# Patient Record
Sex: Female | Born: 1975 | Race: White | Hispanic: No | Marital: Married | Smoking: Never smoker
Health system: Southern US, Community
[De-identification: ages and names within clinical notes are randomized; demographics above are authoritative.]

## PROBLEM LIST (undated history)

## (undated) DIAGNOSIS — R894 Abnormal immunological findings in specimens from other organs, systems and tissues: Secondary | ICD-10-CM

## (undated) DIAGNOSIS — R87619 Unspecified abnormal cytological findings in specimens from cervix uteri: Secondary | ICD-10-CM

## (undated) DIAGNOSIS — D509 Iron deficiency anemia, unspecified: Secondary | ICD-10-CM

## (undated) DIAGNOSIS — K8020 Calculus of gallbladder without cholecystitis without obstruction: Secondary | ICD-10-CM

## (undated) DIAGNOSIS — Z8701 Personal history of pneumonia (recurrent): Secondary | ICD-10-CM

## (undated) DIAGNOSIS — J302 Other seasonal allergic rhinitis: Secondary | ICD-10-CM

## (undated) DIAGNOSIS — E039 Hypothyroidism, unspecified: Secondary | ICD-10-CM

## (undated) DIAGNOSIS — Z8619 Personal history of other infectious and parasitic diseases: Secondary | ICD-10-CM

## (undated) DIAGNOSIS — IMO0002 Reserved for concepts with insufficient information to code with codable children: Secondary | ICD-10-CM

## (undated) HISTORY — DX: Calculus of gallbladder without cholecystitis without obstruction: K80.20

## (undated) HISTORY — DX: Iron deficiency anemia, unspecified: D50.9

## (undated) HISTORY — PX: WISDOM TOOTH EXTRACTION: SHX21

## (undated) HISTORY — DX: Hypothyroidism, unspecified: E03.9

## (undated) HISTORY — DX: Personal history of other infectious and parasitic diseases: Z86.19

## (undated) HISTORY — DX: Other seasonal allergic rhinitis: J30.2

## (undated) HISTORY — DX: Abnormal immunological findings in specimens from other organs, systems and tissues: R89.4

---

## 1998-01-26 ENCOUNTER — Encounter (HOSPITAL_COMMUNITY): Admission: RE | Admit: 1998-01-26 | Discharge: 1998-04-26 | Payer: Self-pay

## 1998-11-29 ENCOUNTER — Other Ambulatory Visit: Admission: RE | Admit: 1998-11-29 | Discharge: 1998-11-29 | Payer: Self-pay | Admitting: Obstetrics and Gynecology

## 2000-03-21 ENCOUNTER — Other Ambulatory Visit: Admission: RE | Admit: 2000-03-21 | Discharge: 2000-03-21 | Payer: Self-pay | Admitting: Obstetrics and Gynecology

## 2009-07-07 ENCOUNTER — Inpatient Hospital Stay (HOSPITAL_COMMUNITY): Admission: AD | Admit: 2009-07-07 | Discharge: 2009-07-07 | Payer: Self-pay | Admitting: Obstetrics and Gynecology

## 2009-12-08 ENCOUNTER — Inpatient Hospital Stay (HOSPITAL_COMMUNITY): Admission: AD | Admit: 2009-12-08 | Discharge: 2009-12-11 | Payer: Self-pay | Admitting: Obstetrics and Gynecology

## 2011-01-10 LAB — CBC
HCT: 25 % — ABNORMAL LOW (ref 36.0–46.0)
HCT: 35.5 % — ABNORMAL LOW (ref 36.0–46.0)
Hemoglobin: 8.3 g/dL — ABNORMAL LOW (ref 12.0–15.0)
MCHC: 33 g/dL (ref 30.0–36.0)
MCV: 87.2 fL (ref 78.0–100.0)
Platelets: 190 10*3/uL (ref 150–400)
RDW: 15.4 % (ref 11.5–15.5)

## 2011-01-10 LAB — RH IMMUNE GLOB WKUP(>/=20WKS)(NOT WOMEN'S HOSP)

## 2011-01-10 LAB — RPR: RPR Ser Ql: NONREACTIVE

## 2011-01-26 LAB — CBC
HCT: 37.8 % (ref 36.0–46.0)
MCV: 91.6 fL (ref 78.0–100.0)
Platelets: 217 10*3/uL (ref 150–400)
RBC: 4.13 MIL/uL (ref 3.87–5.11)
WBC: 12.8 10*3/uL — ABNORMAL HIGH (ref 4.0–10.5)

## 2011-01-26 LAB — RAPID STREP SCREEN (MED CTR MEBANE ONLY): Streptococcus, Group A Screen (Direct): NEGATIVE

## 2011-04-03 LAB — ABO/RH: RH Type: NEGATIVE

## 2011-04-03 LAB — HIV ANTIBODY (ROUTINE TESTING W REFLEX): HIV: NONREACTIVE

## 2011-04-18 LAB — GC/CHLAMYDIA PROBE AMP, GENITAL: Chlamydia: NEGATIVE

## 2011-05-16 ENCOUNTER — Other Ambulatory Visit (HOSPITAL_COMMUNITY): Payer: Self-pay | Admitting: Obstetrics and Gynecology

## 2011-05-16 DIAGNOSIS — R772 Abnormality of alphafetoprotein: Secondary | ICD-10-CM

## 2011-05-16 DIAGNOSIS — Z3689 Encounter for other specified antenatal screening: Secondary | ICD-10-CM

## 2011-06-12 ENCOUNTER — Encounter (HOSPITAL_COMMUNITY): Payer: Self-pay

## 2011-06-12 ENCOUNTER — Ambulatory Visit (HOSPITAL_COMMUNITY): Payer: 59

## 2011-10-18 ENCOUNTER — Encounter (HOSPITAL_COMMUNITY): Payer: Self-pay | Admitting: *Deleted

## 2011-10-18 ENCOUNTER — Inpatient Hospital Stay (HOSPITAL_COMMUNITY)
Admission: AD | Admit: 2011-10-18 | Discharge: 2011-10-18 | Disposition: A | Discharge status: Home / Self Care | Payer: 59 | Source: Ambulatory Visit | Attending: Obstetrics and Gynecology | Admitting: Obstetrics and Gynecology

## 2011-10-18 DIAGNOSIS — O479 False labor, unspecified: Secondary | ICD-10-CM | POA: Insufficient documentation

## 2011-10-18 NOTE — Progress Notes (Signed)
Pt reports contractions since 2230, diarrhea.

## 2011-10-23 NOTE — L&D Delivery Note (Signed)
Delivery Note At 8:55 AM a viable female was delivered via NSVD  (Presentation:OA ;  ).  APGAR:8/9 , ; weight .   Placenta status:INTACT , .3 VESSEL  Cord:  with the following complications: NONE  Anesthesia: Epidural  Episiotomy: NONE Lacerations: NONE Suture Repair:  Est. Blood Loss (mL): 400 CC  Mom to AICU.  Baby to nursery-stable.  Walburga Hudman S 10/25/2011, 9:11 AM

## 2011-10-24 ENCOUNTER — Inpatient Hospital Stay (HOSPITAL_COMMUNITY)
Admission: AD | Admit: 2011-10-24 | Discharge: 2011-10-27 | DRG: 774 | Disposition: A | Discharge status: Home / Self Care | Payer: 59 | Source: Ambulatory Visit | Attending: Obstetrics and Gynecology | Admitting: Obstetrics and Gynecology

## 2011-10-24 ENCOUNTER — Other Ambulatory Visit: Payer: Self-pay | Admitting: Obstetrics and Gynecology

## 2011-10-24 ENCOUNTER — Encounter (HOSPITAL_COMMUNITY): Payer: Self-pay | Admitting: *Deleted

## 2011-10-24 DIAGNOSIS — IMO0002 Reserved for concepts with insufficient information to code with codable children: Principal | ICD-10-CM | POA: Diagnosis present

## 2011-10-24 DIAGNOSIS — O1490 Unspecified pre-eclampsia, unspecified trimester: Secondary | ICD-10-CM

## 2011-10-24 HISTORY — DX: Unspecified abnormal cytological findings in specimens from cervix uteri: R87.619

## 2011-10-24 HISTORY — DX: Reserved for concepts with insufficient information to code with codable children: IMO0002

## 2011-10-24 LAB — COMPREHENSIVE METABOLIC PANEL
AST: 89 U/L — ABNORMAL HIGH (ref 0–37)
Albumin: 2.2 g/dL — ABNORMAL LOW (ref 3.5–5.2)
Calcium: 8.3 mg/dL — ABNORMAL LOW (ref 8.4–10.5)
Creatinine, Ser: 0.65 mg/dL (ref 0.50–1.10)
GFR calc non Af Amer: >90 mL/min (ref >90)

## 2011-10-24 LAB — CBC
MCH: 24.6 pg — ABNORMAL LOW (ref 26.0–34.0)
MCV: 77.8 fL — ABNORMAL LOW (ref 78.0–100.0)
Platelets: 210 10*3/uL (ref 150–400)
RDW: 14.8 % (ref 11.5–15.5)

## 2011-10-24 MED ORDER — MAGNESIUM SULFATE 40 G IN LACTATED RINGERS - SIMPLE
2.0000 g/h | INTRAVENOUS | Status: DC
  Administered 2011-10-24: 2 g/h via INTRAVENOUS
  Filled 2011-10-24: qty 500

## 2011-10-24 MED ORDER — PENICILLIN G POTASSIUM 5000000 UNITS IJ SOLR
5.0000 10*6.[IU] | Freq: Once | INTRAVENOUS | Status: AC
  Administered 2011-10-25: 5 10*6.[IU] via INTRAVENOUS
  Filled 2011-10-24: qty 5

## 2011-10-24 MED ORDER — ZOLPIDEM TARTRATE 10 MG PO TABS: 10.0000 mg | ORAL_TABLET | Freq: Every evening | ORAL | Status: DC | PRN

## 2011-10-24 MED ORDER — CITRIC ACID-SODIUM CITRATE 334-500 MG/5ML PO SOLN: 30.0000 mL | ORAL | Status: DC | PRN

## 2011-10-24 MED ORDER — LIDOCAINE HCL (PF) 1 % IJ SOLN
30.0000 mL | INTRAMUSCULAR | Status: DC | PRN
  Filled 2011-10-24: qty 30

## 2011-10-24 MED ORDER — TERBUTALINE SULFATE 1 MG/ML IJ SOLN: 0.2500 mg | Freq: Once | INTRAMUSCULAR | Status: AC | PRN

## 2011-10-24 MED ORDER — ONDANSETRON HCL 4 MG/2ML IJ SOLN: 4.0000 mg | Freq: Four times a day (QID) | INTRAMUSCULAR | Status: DC | PRN

## 2011-10-24 MED ORDER — ACETAMINOPHEN 325 MG PO TABS: 650.0000 mg | ORAL_TABLET | ORAL | Status: DC | PRN

## 2011-10-24 MED ORDER — OXYTOCIN 20 UNITS IN LACTATED RINGERS INFUSION - SIMPLE
125.0000 mL/h | Freq: Once | INTRAVENOUS | Status: AC
  Administered 2011-10-25: 125 mL/h via INTRAVENOUS
  Filled 2011-10-24: qty 1000

## 2011-10-24 MED ORDER — LACTATED RINGERS IV SOLN: 500.0000 mL | INTRAVENOUS | Status: DC | PRN

## 2011-10-24 MED ORDER — PENICILLIN G POTASSIUM 5000000 UNITS IJ SOLR
2.5000 10*6.[IU] | INTRAVENOUS | Status: DC
  Administered 2011-10-25 (×2): 2.5 10*6.[IU] via INTRAVENOUS
  Filled 2011-10-24 (×4): qty 2.5

## 2011-10-24 MED ORDER — IBUPROFEN 600 MG PO TABS: 600.0000 mg | ORAL_TABLET | Freq: Four times a day (QID) | ORAL | Status: DC | PRN

## 2011-10-24 MED ORDER — OXYTOCIN BOLUS FROM INFUSION
500.0000 mL | Freq: Once | INTRAVENOUS | Status: DC
  Filled 2011-10-24: qty 500

## 2011-10-24 MED ORDER — FLEET ENEMA 7-19 GM/118ML RE ENEM: 1.0000 | ENEMA | RECTAL | Status: DC | PRN

## 2011-10-24 MED ORDER — OXYCODONE-ACETAMINOPHEN 5-325 MG PO TABS: 2.0000 | ORAL_TABLET | ORAL | Status: DC | PRN

## 2011-10-24 MED ORDER — OXYTOCIN 20 UNITS IN LACTATED RINGERS INFUSION - SIMPLE
1.0000 m[IU]/min | INTRAVENOUS | Status: DC
  Administered 2011-10-24: 1 m[IU]/min via INTRAVENOUS
  Filled 2011-10-24: qty 1000

## 2011-10-24 MED ORDER — MAGNESIUM SULFATE BOLUS VIA INFUSION
4.0000 g | Freq: Once | INTRAVENOUS | Status: AC
  Administered 2011-10-24: 4 g via INTRAVENOUS
  Filled 2011-10-24: qty 500

## 2011-10-24 MED ORDER — LACTATED RINGERS IV SOLN
INTRAVENOUS | Status: DC
  Administered 2011-10-24: 23:00:00 via INTRAVENOUS

## 2011-10-24 NOTE — Progress Notes (Signed)
  Subjective:    Patient ID: Katherine Dougherty, female    DOB: 1976-05-11, 36 y.o.   MRN: [id number]  HPI    Review of Systems     Objective:   Physical Exam        Assessment & Plan:

## 2011-10-24 NOTE — H&P (Signed)
  Brett Soza  DICTATION # [id number] CSN# [id number]   Meriel Pica, MD 10/24/2011 10:52 PM

## 2011-10-25 ENCOUNTER — Encounter (HOSPITAL_COMMUNITY): Payer: Self-pay | Admitting: *Deleted

## 2011-10-25 ENCOUNTER — Encounter (HOSPITAL_COMMUNITY): Payer: Self-pay | Admitting: Anesthesiology

## 2011-10-25 ENCOUNTER — Inpatient Hospital Stay (HOSPITAL_COMMUNITY): Payer: 59 | Admitting: Anesthesiology

## 2011-10-25 LAB — CBC
Hemoglobin: 10.4 g/dL — ABNORMAL LOW (ref 12.0–15.0)
MCHC: 31.1 g/dL (ref 30.0–36.0)
RDW: 15 % (ref 11.5–15.5)
WBC: 15.9 10*3/uL — ABNORMAL HIGH (ref 4.0–10.5)

## 2011-10-25 LAB — MRSA PCR SCREENING: MRSA by PCR: NEGATIVE

## 2011-10-25 MED ORDER — ONDANSETRON HCL 4 MG/2ML IJ SOLN: 4.0000 mg | INTRAMUSCULAR | Status: DC | PRN

## 2011-10-25 MED ORDER — LACTATED RINGERS IV SOLN
INTRAVENOUS | Status: DC
  Administered 2011-10-25: 22:00:00 via INTRAVENOUS

## 2011-10-25 MED ORDER — ZOLPIDEM TARTRATE 5 MG PO TABS: 5.0000 mg | ORAL_TABLET | Freq: Every evening | ORAL | Status: DC | PRN

## 2011-10-25 MED ORDER — FLEET ENEMA 7-19 GM/118ML RE ENEM: 1.0000 | ENEMA | Freq: Every day | RECTAL | Status: DC | PRN

## 2011-10-25 MED ORDER — DIPHENHYDRAMINE HCL 25 MG PO CAPS: 25.0000 mg | ORAL_CAPSULE | Freq: Four times a day (QID) | ORAL | Status: DC | PRN

## 2011-10-25 MED ORDER — MAGNESIUM SULFATE 40 G IN LACTATED RINGERS - SIMPLE
2.0000 g/h | INTRAVENOUS | Status: DC
  Administered 2011-10-25: 2 g/h via INTRAVENOUS
  Filled 2011-10-25: qty 500

## 2011-10-25 MED ORDER — WITCH HAZEL-GLYCERIN EX PADS: 1.0000 "application " | MEDICATED_PAD | CUTANEOUS | Status: DC | PRN

## 2011-10-25 MED ORDER — BISACODYL 10 MG RE SUPP: 10.0000 mg | Freq: Every day | RECTAL | Status: DC | PRN

## 2011-10-25 MED ORDER — EPHEDRINE 5 MG/ML INJ
10.0000 mg | INTRAVENOUS | Status: DC | PRN
  Filled 2011-10-25: qty 4

## 2011-10-25 MED ORDER — SODIUM BICARBONATE 8.4 % IV SOLN
INTRAVENOUS | Status: DC | PRN
  Administered 2011-10-25: 4 mL via EPIDURAL

## 2011-10-25 MED ORDER — SIMETHICONE 80 MG PO CHEW: 80.0000 mg | CHEWABLE_TABLET | ORAL | Status: DC | PRN

## 2011-10-25 MED ORDER — PHENYLEPHRINE 40 MCG/ML (10ML) SYRINGE FOR IV PUSH (FOR BLOOD PRESSURE SUPPORT): 80.0000 ug | PREFILLED_SYRINGE | INTRAVENOUS | Status: DC | PRN

## 2011-10-25 MED ORDER — FENTANYL 2.5 MCG/ML BUPIVACAINE 1/10 % EPIDURAL INFUSION (WH - ANES)
INTRAMUSCULAR | Status: DC | PRN
  Administered 2011-10-25 (×2): 14 mL/h via EPIDURAL

## 2011-10-25 MED ORDER — LANOLIN HYDROUS EX OINT: TOPICAL_OINTMENT | CUTANEOUS | Status: DC | PRN

## 2011-10-25 MED ORDER — BENZOCAINE-MENTHOL 20-0.5 % EX AERO: 1.0000 "application " | INHALATION_SPRAY | CUTANEOUS | Status: DC | PRN

## 2011-10-25 MED ORDER — ONDANSETRON HCL 4 MG PO TABS: 4.0000 mg | ORAL_TABLET | ORAL | Status: DC | PRN

## 2011-10-25 MED ORDER — DIPHENHYDRAMINE HCL 50 MG/ML IJ SOLN: 12.5000 mg | INTRAMUSCULAR | Status: DC | PRN

## 2011-10-25 MED ORDER — OXYCODONE-ACETAMINOPHEN 5-325 MG PO TABS
1.0000 | ORAL_TABLET | ORAL | Status: DC | PRN
  Administered 2011-10-25 (×2): 1 via ORAL
  Administered 2011-10-25: 2 via ORAL
  Filled 2011-10-25 (×2): qty 1
  Filled 2011-10-25: qty 2

## 2011-10-25 MED ORDER — IBUPROFEN 600 MG PO TABS
600.0000 mg | ORAL_TABLET | Freq: Four times a day (QID) | ORAL | Status: DC
  Administered 2011-10-25 – 2011-10-27 (×9): 600 mg via ORAL
  Filled 2011-10-25 (×10): qty 1

## 2011-10-25 MED ORDER — PRENATAL MULTIVITAMIN CH
1.0000 | ORAL_TABLET | Freq: Every day | ORAL | Status: DC
  Administered 2011-10-25 – 2011-10-27 (×3): 1 via ORAL
  Filled 2011-10-25 (×3): qty 1

## 2011-10-25 MED ORDER — PHENYLEPHRINE 40 MCG/ML (10ML) SYRINGE FOR IV PUSH (FOR BLOOD PRESSURE SUPPORT)
80.0000 ug | PREFILLED_SYRINGE | INTRAVENOUS | Status: DC | PRN
  Administered 2011-10-25: 80 ug via INTRAVENOUS
  Filled 2011-10-25: qty 5

## 2011-10-25 MED ORDER — EPHEDRINE 5 MG/ML INJ: 10.0000 mg | INTRAVENOUS | Status: DC | PRN

## 2011-10-25 MED ORDER — DIBUCAINE 1 % RE OINT: 1.0000 "application " | TOPICAL_OINTMENT | RECTAL | Status: DC | PRN

## 2011-10-25 MED ORDER — FENTANYL 2.5 MCG/ML BUPIVACAINE 1/10 % EPIDURAL INFUSION (WH - ANES)
14.0000 mL/h | INTRAMUSCULAR | Status: DC
  Filled 2011-10-25 (×2): qty 60

## 2011-10-25 MED ORDER — SENNOSIDES-DOCUSATE SODIUM 8.6-50 MG PO TABS
2.0000 | ORAL_TABLET | Freq: Every day | ORAL | Status: DC
  Administered 2011-10-25 – 2011-10-26 (×2): 2 via ORAL

## 2011-10-25 MED ORDER — TETANUS-DIPHTH-ACELL PERTUSSIS 5-2.5-18.5 LF-MCG/0.5 IM SUSP
0.5000 mL | Freq: Once | INTRAMUSCULAR | Status: DC
  Filled 2011-10-25: qty 0.5

## 2011-10-25 MED ORDER — LACTATED RINGERS IV SOLN: 500.0000 mL | Freq: Once | INTRAVENOUS | Status: DC

## 2011-10-25 NOTE — Anesthesia Postprocedure Evaluation (Signed)
  Anesthesia Post-op Note  Patient: Katherine Dougherty  Procedure(s) Performed: * No procedures listed *  Patient Location: 372  Anesthesia Type: Epidural  Level of Consciousness: awake, alert  and oriented  Airway and Oxygen Therapy: Patient Spontanous Breathing  Post-op Pain: mild  Post-op Assessment: Post-op Vital signs reviewed, Patient's Cardiovascular Status Stable, No headache, No backache, No residual numbness and No residual motor weakness  Post-op Vital Signs: Reviewed and stable  Complications: No apparent anesthesia complications

## 2011-10-25 NOTE — Progress Notes (Signed)
Patient ID: Katherine Dougherty, female   DOB: Aug 12, 1976, 36 y.o.   MRN: [id number] IUP AT 37.0 WITH PIH AND ELEVATED LFT'S BP OK VSS CERVIX RIM WITH VTX AT -1 FHR WITH ACCELS GOOD VARIBILITY SOME MILD VARIABLES ON PITOCIN AND MAGNESIUM CONTINUE SHOULD BE PUSHING SOON

## 2011-10-25 NOTE — Progress Notes (Deleted)
Patient ID: Katherine Dougherty, female   DOB: 09/22/1976, 37 y.o.   MRN: [id number] Up to 16 mu of pitocin bp's doing well Cervix 80 % ft dilation vtx at -1 some effacemeent fhr flat ritght now some accel no decel bu did receive stadol and phenergan Still om magnesium disscussed options either primary c/s versus repeat cytotec  Desires cytotec and try third day of induction

## 2011-10-25 NOTE — Anesthesia Procedure Notes (Signed)

## 2011-10-25 NOTE — H&P (Signed)
NAMEPARNEET, Katherine Dougherty NO.:  [id number]  MEDICAL RECORD NO.:  [id number]  LOCATION:                                 FACILITY:  PHYSICIAN:  Duke Salvia. Marcelle Overlie, M.D.DATE OF BIRTH:  1976-01-28  DATE OF ADMISSION: DATE OF DISCHARGE:                             HISTORY & PHYSICAL   CHIEF COMPLAINT:  Thirty 35 weeks, preeclampsia, elevated liver function test.  HISTORY OF PRESENT ILLNESS:  This is a 36 year old, G2, P4, EDD November 12, 2011.  She is a 35+ weeks, was seen in the office late last week with proteinuria and elevated blood pressure, NST was reactive.  She was brought back in today for followup.  Today, BP was 150/82.  Urine was 0- 1+ protein with reactive NST.  Cervix was favorable for induction.  PIH labs were drawn, came back late this evening showing elevations of her liver function tests.  Platelets were normal.  She presents now for labor induction near term with preeclampsia.  She is Rh negative.  PAST MEDICAL HISTORY:  Please see the [facility] form for details.  PHYSICAL EXAMINATION:  VITAL SIGNS:  Temp 98.2, blood pressure 150/82. HEENT:  Unremarkable. NECK:  Supple without masses. LUNGS:  Clear. CARDIOVASCULAR:  Regular rate and rhythm without murmurs, rubs, or gallops. BREASTS:  Not examined. ABDOMEN:  35-cm fundal height.  Fetal heart rate was 142.  Cervix was 3, 75%, vertex, -2. EXTREMITIES:  Reflexes 1 to 2+, no clonus, 1 to 2+ edema noted also.  IMPRESSION: 1. A 35 plus-week intrauterine pregnancy. 2. Favorable cervix for induction. 3. Preeclampsia with elevated liver function tests.  PLAN:  Admit for magnesium sulfate, AROM, labor induction.     Roberto Romanoski M. Marcelle Overlie, M.D.     [facility]/MEDQ  D:  10/24/2011  T:  10/24/2011  Job:  [id number]

## 2011-10-25 NOTE — Plan of Care (Signed)
Problem: Phase I Progression Outcomes Goal: Other Phase I Outcomes/Goals Outcome: Progressing Pt presents c elevated Liver enzymes post Vaginal delivery, BP's slightly elevated. 140's/80's. Placed on Magnesium Sulfate.

## 2011-10-25 NOTE — Anesthesia Preprocedure Evaluation (Signed)
Anesthesia Evaluation  Patient identified by MRN, date of birth, ID band Patient awake    Reviewed: Allergy & Precautions, H&P , Patient's Chart, lab work & pertinent test results  Airway Mallampati: III TM Distance: >3 FB Neck ROM: full    Dental  (+) Teeth Intact   Pulmonary  clear to auscultation        Cardiovascular hypertension (Preecclampsia-on Mg+2), regular Normal    Neuro/Psych    GI/Hepatic   Endo/Other  Morbid obesity  Renal/GU      Musculoskeletal   Abdominal   Peds  Hematology   Anesthesia Other Findings       Reproductive/Obstetrics (+) Pregnancy                           Anesthesia Physical Anesthesia Plan  ASA: III  Anesthesia Plan: Epidural   Post-op Pain Management:    Induction:   Airway Management Planned:   Additional Equipment:   Intra-op Plan:   Post-operative Plan:   Informed Consent: I have reviewed the patients History and Physical, chart, labs and discussed the procedure including the risks, benefits and alternatives for the proposed anesthesia with the patient or authorized representative who has indicated his/her understanding and acceptance.   Dental Advisory Given  Plan Discussed with:   Anesthesia Plan Comments: (Labs checked- platelets confirmed with RN in room. Fetal heart tracing, per RN, reported to be stable enough for sitting procedure. Discussed epidural, and patient consents to the procedure:  included risk of possible headache,backache, failed block, allergic reaction, and nerve injury. This patient was asked if she had any questions or concerns before the procedure started. )        Anesthesia Quick Evaluation

## 2011-10-26 LAB — COMPREHENSIVE METABOLIC PANEL
ALT: 42 U/L — ABNORMAL HIGH (ref 0–35)
AST: 60 U/L — ABNORMAL HIGH (ref 0–37)
Albumin: 1.7 g/dL — ABNORMAL LOW (ref 3.5–5.2)
Alkaline Phosphatase: 122 U/L — ABNORMAL HIGH (ref 39–117)
CO2: 24 mEq/L (ref 19–32)
Chloride: 106 mEq/L (ref 96–112)
Potassium: 3.9 mEq/L (ref 3.5–5.1)
Total Bilirubin: 0.1 mg/dL — ABNORMAL LOW (ref 0.3–1.2)

## 2011-10-26 LAB — CBC
Platelets: 222 10*3/uL (ref 150–400)
RBC: 3.71 MIL/uL — ABNORMAL LOW (ref 3.87–5.11)
RDW: 15 % (ref 11.5–15.5)
WBC: 9.6 10*3/uL (ref 4.0–10.5)

## 2011-10-26 NOTE — Progress Notes (Signed)
UR chart review completed.  

## 2011-10-26 NOTE — Plan of Care (Signed)
Problem: Discharge Progression Outcomes Goal: Complications resolved/controlled Outcome: Progressing Still on post mag VS and I+O for fluid monitoring until am.

## 2011-10-26 NOTE — Progress Notes (Signed)
Post Partum Day 1 Subjective: no complaints and tolerating PO  Objective: Blood pressure 126/61, pulse 88, temperature 98 F (36.7 C), temperature source Oral, resp. rate 20, height 5\' 6"  (1.676 m), weight 110.859 kg (244 lb 6.4 oz), SpO2 97.00%, unknown if currently breastfeeding.  Physical Exam:  General: alert, cooperative and no distress Lochia: appropriate Uterine Fundus: firm Incision:  DVT Evaluation: No evidence of DVT seen on physical exam.   Basename 10/26/11 0530 10/25/11 1007  HGB 9.0* 10.4*  HCT 29.0* 33.4*    Assessment/Plan: PP preeclampsia UOP good Labs improving DC MG and transfer to floor   LOS: 2 days   Katherine Dougherty C 10/26/2011, 9:54 AM

## 2011-10-27 LAB — COMPREHENSIVE METABOLIC PANEL
AST: 39 U/L — ABNORMAL HIGH (ref 0–37)
CO2: 24 mEq/L (ref 19–32)
Calcium: 7.7 mg/dL — ABNORMAL LOW (ref 8.4–10.5)
Creatinine, Ser: 0.75 mg/dL (ref 0.50–1.10)
GFR calc non Af Amer: >90 mL/min (ref >90)
Total Protein: 4.9 g/dL — ABNORMAL LOW (ref 6.0–8.3)

## 2011-10-27 LAB — CBC
MCH: 24.7 pg — ABNORMAL LOW (ref 26.0–34.0)
MCHC: 31.4 g/dL (ref 30.0–36.0)
MCV: 78.8 fL (ref 78.0–100.0)
Platelets: 213 10*3/uL (ref 150–400)
RDW: 15.2 % (ref 11.5–15.5)

## 2011-10-27 LAB — URIC ACID: Uric Acid, Serum: 6 mg/dL (ref 2.4–7.0)

## 2011-10-27 NOTE — Progress Notes (Signed)
Post Partum Day 2 Subjective: no complaints, up ad lib and tolerating PO  Objective: Blood pressure 93/54, pulse 87, temperature 97.9 F (36.6 C), temperature source Oral, resp. rate 18, height 5\' 6"  (1.676 m), weight 110.859 kg (244 lb 6.4 oz), SpO2 98.00%, unknown if currently breastfeeding.  Physical Exam:  General: alert, cooperative and no distress Lochia: appropriate Uterine Fundus: firm Incision:  DVT Evaluation: No evidence of DVT seen on physical exam.   Basename 10/27/11 0752 10/26/11 0530  HGB 9.0* 9.0*  HCT 28.7* 29.0*    Assessment/Plan: Plan for discharge tomorrow Stable post Mag for Preeclampsia, BPS stable, Labs Still with elevated SGOT/SGPT Recheck in am   LOS: 3 days   Timica Marcom C 10/27/2011, 8:37 AM

## 2011-10-27 NOTE — Progress Notes (Signed)
Pt without complaints Desires dc home VSSAF  SGOT and SGPT only mildly elevated  DC home with fu 1 week for BP check and repeat labs DL

## 2011-10-29 NOTE — Discharge Summary (Signed)
Obstetric Discharge Summary Reason for Admission: induction of labor Prenatal Procedures: ultrasound Intrapartum Procedures: spontaneous vaginal delivery Postpartum Procedures: none Complications-Operative and Postpartum: none Hemoglobin  Date Value Range Status  10/27/2011 9.0* 12.0-15.0 (g/dL) Final     HCT  Date Value Range Status  10/27/2011 28.7* 36.0-46.0 (%) Final    Discharge Diagnoses: s/p spont vag delivery at 36 weeks, PIH  Discharge Information: Date: 10/29/2011 Activity: pelvic rest Diet: routine Medications: PNV and Ibuprofen Condition: stable Instructions: refer to practice specific booklet Discharge to: home   Newborn Data: Live born female  Birth Weight: 7 lb 15.9 oz (3625 g) APGAR: 9, 9  Home with mother.  Shayaan Parke G 10/29/2011, 8:30 AM

## 2011-12-10 ENCOUNTER — Ambulatory Visit (HOSPITAL_COMMUNITY)
Admission: RE | Admit: 2011-12-10 | Discharge: 2011-12-10 | Disposition: A | Discharge status: Home / Self Care | Payer: 59 | Source: Ambulatory Visit | Attending: Obstetrics and Gynecology | Admitting: Obstetrics and Gynecology

## 2011-12-10 NOTE — Progress Notes (Signed)
Adult Lactation Consultation Outpatient Visit Note  Patient Name: Katherine Dougherty     BABY:  Katherine Dougherty Date of Birth: 16-Jan-1976          DOB:  [birth year]   BIRTH WEIGHT:  7-15.9 Gestational Age at Delivery: 37.4        WEIGHT TODAY:  11-14.4 Type of Delivery: NSVD  Breastfeeding History: Frequency of Breastfeeding: 4-6 TIMES/24 HOURS Length of Feeding: 15-30 MINUTES Voids: QS Stools: QS  Supplementing / Method:  BOTTLE PC OR IN PLACE OF FEEDINGS 4-6 TIMES/DAY Pumping:  Type of Pump:DEBP   Frequency:6-8 TIMES/24 HOURS  Volume:  4-5 OZ PER BREAST  Comments:    Consultation Evaluation:  Mom is here today to build her confidence that she can breastfeed baby without pumping pc and bottlefeeding.  Mom states she is a " control freak" and got into the habit of pumping and bottlefeeding for the first 2 weeks when baby was receiving phototherapy.  Discussed what her breastfeeding goals were and she states it would be easier if baby could breastfeed more and she could do less pumping.  Mom states she worries baby doesn't get enough at the breast so she  pumps after and offers pc bottle most of the time.  A lot of teaching done on how to assess a good feeding with good milk transfer.  She has a very good milk supply and baby is gaining great weight.  Observed a feeding and only minor adjustments needed with positioning.  Baby took several minutes of popping on and off before getting into a rhythmic suck/swallow pattern x 15 min.  Breast was very full prior to feeding and softened well after feeding.  Softening of breast, audible gulping and very relaxed baby after feeding pointed out to mom.  Baby transferred 118 mls at feeding.  Mom states she feels more confident and plans to begin weaning slowly from extra pumping.  Initial Feeding Assessment:Right breast for 15 min. Pre-feed [id number] Post-feed [id number] Amount Transferred:118 mls Comments:  Additional Feeding Assessment: Pre-feed  Weight: Post-feed Weight: Amount Transferred: Comments:  Additional Feeding Assessment: Pre-feed Weight: Post-feed Weight: Amount Transferred: Comments:  Total Breast milk Transferred this Visit: 118 mls Total Supplement Given: none  Additional Interventions:   Follow-Up  Call prn      Hansel Feinstein 12/10/2011, 5:42 PM

## 2014-03-05 ENCOUNTER — Other Ambulatory Visit: Payer: Self-pay | Admitting: Internal Medicine

## 2014-03-05 ENCOUNTER — Ambulatory Visit
Admission: RE | Admit: 2014-03-05 | Discharge: 2014-03-05 | Disposition: A | Discharge status: Home / Self Care | Payer: BC Managed Care – PPO | Source: Ambulatory Visit | Attending: Internal Medicine | Admitting: Internal Medicine

## 2014-03-05 DIAGNOSIS — R221 Localized swelling, mass and lump, neck: Principal | ICD-10-CM

## 2014-03-05 DIAGNOSIS — R220 Localized swelling, mass and lump, head: Secondary | ICD-10-CM

## 2014-08-23 ENCOUNTER — Encounter (HOSPITAL_COMMUNITY): Payer: Self-pay | Admitting: *Deleted

## 2015-06-15 ENCOUNTER — Ambulatory Visit (INDEPENDENT_AMBULATORY_CARE_PROVIDER_SITE_OTHER): Payer: BLUE CROSS/BLUE SHIELD | Admitting: Internal Medicine

## 2015-06-15 ENCOUNTER — Encounter: Payer: Self-pay | Admitting: Internal Medicine

## 2015-06-15 VITALS — BP 150/100 | Temp 98.7°F | Ht 65.75 in | Wt 241.8 lb

## 2015-06-15 DIAGNOSIS — M2550 Pain in unspecified joint: Secondary | ICD-10-CM

## 2015-06-15 DIAGNOSIS — Z8379 Family history of other diseases of the digestive system: Secondary | ICD-10-CM

## 2015-06-15 DIAGNOSIS — IMO0001 Reserved for inherently not codable concepts without codable children: Secondary | ICD-10-CM

## 2015-06-15 DIAGNOSIS — Z2821 Immunization not carried out because of patient refusal: Secondary | ICD-10-CM | POA: Diagnosis not present

## 2015-06-15 DIAGNOSIS — R030 Elevated blood-pressure reading, without diagnosis of hypertension: Secondary | ICD-10-CM

## 2015-06-15 DIAGNOSIS — L509 Urticaria, unspecified: Secondary | ICD-10-CM

## 2015-06-15 DIAGNOSIS — E039 Hypothyroidism, unspecified: Secondary | ICD-10-CM

## 2015-06-15 DIAGNOSIS — E079 Disorder of thyroid, unspecified: Secondary | ICD-10-CM

## 2015-06-15 LAB — FERRITIN: Ferritin: 45.4 ng/mL (ref 10.0–291.0)

## 2015-06-15 LAB — IBC PANEL
IRON: 66 ug/dL (ref 42–145)
Saturation Ratios: 16 % — ABNORMAL LOW (ref 20.0–50.0)
TRANSFERRIN: 295 mg/dL (ref 212.0–360.0)

## 2015-06-15 NOTE — Patient Instructions (Addendum)
Get [place] a copy of  Your labs   And ov .   From [facility].  For now.  Genetic test for celiac, celiac 10 panel, ferritin , ibc level,  Consideration of seeing Gi for a firm diagnosis as possible .  Your blood pressure is up today but could be from stress of ov. Take blood pressure readings twice a day for - 10 - 14 days and then periodically .To ensure below 140/90   .Send in readings      Gluten-Free Diet for Celiac Disease Gluten is a protein found in wheat, rye, barley, and triticale (a cross between wheat and rye) grains. People with celiac disease need to have a gluten-free diet. With celiac disease, gluten interferes with the absorption of food and may also cause intestinal injury.  Strict compliance is important even during symptom-free periods. This means eliminating all foods with gluten from your diet permanently. This requires some significant changes but is very manageable. WHAT DO I NEED TO KNOW ABOUT A GLUTEN-FREE DIET?  Look for items labeled with "GF." Looking for GF will make it easier to identify products that are safe to eat.  Read all labels. Gluten may have been added as a minor ingredient where least expected, such as in shredded cheeses or ice creams. Always check food labels and investigate questionable ingredients. Talk to your dietitian or health care provider if you have questions about certain foods or need help finding GF foods.  Check when in doubt. If you are not sure whether an ingredient contains gluten, check with the manufacturer. Note that some manufacturers may change ingredients without notice. Always read labels.   Know how food is prepared. Since flour and cereal products are often used in the preparation of foods, it is important to be aware of the methods of preparation used, as well as the ingredients in the foods themselves. This is especially true when you are dining out. Ask restaurants if they have a gluten-free menu.  Watch for  cross-contamination. Cross-contamination occurs when gluten-free foods come into contact with foods that contain gluten. It often happens during the manufacturing process. Always check the ingredient list and for warnings on packages, such as "may contain gluten."  Eat a balanced diet. It is important to still get enough fiber, iron, and B vitamins in your diet. Look for enriched whole grain gluten-free products and continue to eat a well-balanced diet of the important non-grain items, such as vegetables, fruit, lean proteins, legumes, and dairy.  Consider taking a gluten-free multivitamin and mineral supplement. Discuss this with your health care provider. WHAT KEY WORDS HELP IDENTIFY GLUTEN? Know key words to help identify gluten. A dietitian can help you identify possible harmful ingredients in the foods you normally eat. Words to check for on food labels include:   Flour, enriched flour, bromated flour, white flour, durum flour, graham flour, phosphated flour, self-rising flour, semolina, or farina.  Starch, dextrin, modified food starch, or cereal.  Thickening, fillers, or emulsifiers.  Any kind of malt flavoring, extract, or syrup (malt is made from barley and includes malt vinegar, malted milk, and malted beverages).  Hydrolyzed vegetable protein. WHAT FOODS CAN I EAT? Below is a list of common foods that are allowed with a gluten-free diet.  Grains Products made from the following flours or grains:amaranth,bean flours, 100% buckwheat flour, corn, millet, nut flours or meals, GF oats, quinoa, rice, sorghum, teff, any all-purpose 100% GF flour mix, rice wafers, pure cornmeal tortillas, popcorn, some  crackers, some chips, and hot cereals made from cornmeal. Ask your dietitian which specific hot and cold cereals are allowed. Hominy, rice or wild rice, and special GF pasta. Some Asian rice noodles or bean noodles. Arrowroot starch, corn bran, corn flour, corn germ, cornmeal, corn starch,  potato flour, potato starch flour, and rice bran. Rice flours: plain, brown, and sweet. Rice polish, soy flour, tapioca starch. Vegetables All plain, fresh, frozen, or canned vegetables.  Fruits All fresh, frozen, canned, dried fruits, and fruit juices.  Meats and Other Protein Foods Meat, fish, poultry, or eggs prepared without added wheat, rye, barley, or triticale. Some luncheon meat and some frankfurters. Pure meat. All aged cheese, most processed cheese products, some cottage cheese, and some cream cheese. Dried beans, dried peas, and lentils.  Dairy Milk and yogurt made with allowed ingredients.  Beverages Coffee (regular or decaffeinated), tea, herbal tea (read label to be sure that no wheat flour has been added). Carbonated beverages and some root beers. Wine, sake, and distilled spirits, such as gin, vodka, and whiskey. GF beers and GF ciders.  Sweetsand Desserts Sugar, honey, some syrups, molasses, jelly, jam, plain hard candy, marshmallows, gumdrops, homemade candies free of wheat, rye, barley, or triticale. Coconut. Custard, some pudding mixes, and homemade puddings from cornstarch, rice, and tapioca. Gelatin desserts, sorbets, frozen ice pops, and sherbet. Cake, cookies, and other desserts prepared with allowed flours. Some commercial ice creams. Ask your dietitian about specific brands of dessert that are allowed.  Fats and Oils Butter, margarine, vegetable oil, sour cream not containing modified food starch, whipping cream, shortening, lard, cream, and some mayonnaise. Some commercial salad dressings. Peanut butter.  Other Homemade broth and soups made with allowed ingredients; some canned or frozen soups. Any other combination or prepared foods that do not contain gluten. Monosodium glutamate (MSG). Cider, rice, and wine vinegar. Baking soda and baking powder. Certain soy sauces (Tamari). Ask your dietitian about specific brands that are allowed. Nuts, coconut, chocolate, and  pure cocoa powder. Salt, pepper, herbs, spices, extracts, and food colorings. The items listed above may not be a complete list of allowed foods or beverages. Contact your dietitian for more options.  WHAT FOODS CAN I NOT EAT? Below is a list of common foods that are not allowed with a gluten-free diet.  Grains Barley, bran, bulgur, cracked wheat, graham, malt, matzo, wheat germ, and all wheat and rye cereals including spelt and kamut. Avoid cereals containing malt as a flavoring, such as rice cereal. Also avoid regular noodles, spaghetti, macaroni, and most packaged rice mixes, and all others containing wheat, rye, barley, or triticale.  Vegetables Most creamed vegetables, most vegetables canned in sauces, and any vegetables prepared with wheat, rye, barley, or triticale.  Fruits Thickened or prepared fruits and some pie fillings.  Meats and Other Protein Sources Any meat or meat alternative containing wheat, rye, barley, or gluten stabilizers (such as some hot dogs, salami, cold cuts, or sausage). Bread-containing products, such as Swiss steak, croquettes, and meatloaf. Most tuna canned in vegetable broth, [place] with hydrolyzed vegetable protein (HVP) injected as part of the basting, and any cheese product containing oat gum as an ingredient. [place]. Imitation fish. Dairy Commercial chocolate milk, which may have cereal added, and malted milk. Beverages Certain cereal beverages. Beer and ciders (unless GF), ale, malted milk, and some root beers. Sweetsand Desserts Commercial candies containing wheat, rye, barley, or triticale. Certain toffees are dusted with wheat flour. Chocolate-coated nuts, which are often rolled in flour. Cakes,  cookies, doughnuts, and pastries that are prepared with wheat, barley, rye, or triticale flour. Some commercial ice creams, ice cream flavors which contain cookies, crumbs, or cheesecake. Ice cream cones. Commercially prepared mixes for cakes, cookies, and other  desserts unless marked GF. Bread pudding and other puddings thickened with flour. Fats and Oils Some commercial salad dressings and sour cream containing modified food starch.  Condiments Some curry powder, some dry seasoning mixes, some gravy extracts, some meat sauces, some ketchup, some prepared mustard, horseradish. Other All soups containing wheat, rye, barley, or triticale flour. Bouillon and bouillon cubes that contain HVP. Combination or prepared foods that contain gluten. Some soy sauce, some chip dips, and some chewing gum. Yeast extract (contains barley). Caramel color (may contain malt). The items listed above may not be a complete list of foods and beverages to avoid. Contact your dietitian for more information. Document Released: 10/08/2005 Document Revised: 02/22/2014 Document Reviewed: 08/12/2013 [facility] Patient Information 2015 [place], [place]. This information is not intended to replace advice given to you by your health care provider. Make sure you discuss any questions you have with your health care provider. Celiac Disease Celiac disease is a digestive disease that causes your body's natural defense system (immune system) to react against its own cells. It interferes with taking in (absorbing) nutrients from food. Celiac disease is also known as celiac sprue, nontropical sprue, and gluten-sensitive enteropathy. People who have celiac disease cannot tolerate gluten. Gluten is a protein found in wheat, rye, and barley. With time, celiac disease will damage the cells lining the small intestine. This leads to being unable to absorb nutrients from food (malabsorption), diarrhea, and nutritional problems. CAUSES  Celiac disease is genetic. This means you have a higher likelihood of getting the disease if someone in your family has or has had it. Up to 10% of your close relatives (parent, sibling, child) may also have the disease.  People with celiac disease tend to have other  autoimmune diseases. The link may be genetic. These diseases include:  Dermatitis herpetiformis.  Thyroid disease.  Systemic lupus erythematosus.  Type 1 diabetes.  Liver disease.  Collagen vascular disease.  Rheumatoid arthritis.  Sjogren syndrome. SYMPTOMS  The symptoms of celiac disease vary from person to person. The symptoms are generally digestive or nutritional. Digestive symptoms include:  Recurring belly (abdominal) bloating and pain.  Gas.  Long-term (chronic) diarrhea.  Pale, bad-smelling, greasy, or oily stool. Nutritional symptoms include:  Failure to thrive in infants.  Delayed growth in children.  Weight loss in children and adults.  Missed menstrual periods (often due to extreme weight loss).  Anemia.  Weakening bones (osteoporosis).  Fatigue and weakness.  Tingling or other signs of nerve damage (peripheral neuropathy).  Depression. DIAGNOSIS  If your symptoms and physical exam suggest that a digestive disorder or malnutrition is present, your caregiver may suspect celiac disease. You may have already begun a gluten-free diet. If symptoms persist, testing may be needed to confirm the diagnosis. Some tests are best done while you are on a normal, unrestricted diet. Tests may include:  Blood tests to check for nutritional deficiencies.  Blood tests to look for evidence that the body is producing antibodies against its own small intestine cells.  Taking a tissue sample (biopsy) from the small bowel for evaluation.  X-rays of the small bowel.  Evaluating the stool for fat.  Tests to check for nutrient absorption from the intestine. TREATMENT  It is important to seek treatment. Untreated celiac disease can  cause growth problems (in children), anemia, osteoporosis, and possible nerve problems. A pregnant patient with untreated celiac disease has a higher risk of miscarriage, and the fetus has an increased risk of low birth weight and other  growth problems. If celiac disease is diagnosed in the early stages, treatment can allow you to live a long, nearly symptom-free life. Treatment includes following a gluten-free diet. This means avoiding all foods that contain gluten. Eating even a small amount of gluten can damage your intestine. For most people, following this diet will stop symptoms. It will heal existing intestinal damage and prevent further damage. Improvements begin within days of starting the diet. The small intestine is usually completely healed within 3 to 6 months, or it may take up to 2 years for older adults. A small percentage of people do not improve on the gluten-free diet. Depending on your age and the stage at which you were diagnosed, some problems such as delayed growth and discolored teeth may not improve. Sometimes, damaged intestines cannot heal. If your intestines are not absorbing enough nutrients, you may need to receive nutrition supplements through an intravenous (IV) tube. Drug treatments are being tested for unresponsive celiac disease. In this case, you may need to be evaluated for complications of the disease. Your caregiver may also recommend:  A pneumonia vaccination.  Nutrients and other treatments for any nutritional deficiencies. Your caregiver can provide you with more information on a gluten-free diet. Discussion with a dietitian skilled in this illness will be valuable. Support groups may also be helpful. HOME CARE INSTRUCTIONS   Focus on a gluten-free diet. This diet must become a way of life.  Monitor your response to the gluten-free diet and treat any nutritional deficiencies.  Prepare ahead of time if you decide to eat outside the home.  Make and keep your regular follow-up visits with your caregiver.  Suggest to family members that they get screened for early signs of the disease. SEEK MEDICAL CARE IF:   You continue to have digestive symptoms (gas, cramping, diarrhea) despite a  proper diet.  You have trouble sticking to the gluten-free diet.  You develop an itchy rash with groups of tiny blisters.  You develop severe weakness, balance problems, menstrual problems, or depression. Document Released: 10/08/2005 Document Revised: 02/22/2014 Document Reviewed: 01/25/2010 [facility] Patient Information 2015 [place], [place]. This information is not intended to replace advice given to you by your health care provider. Make sure you discuss any questions you have with your health care provider.

## 2015-06-15 NOTE — Progress Notes (Signed)
Pre visit review using our clinic review tool, if applicable. No additional management support is needed unless otherwise documented below in the visit note.  Chief Complaint  Patient presents with  . Establish Care    HPI: Patient  Katherine Dougherty  39 y.o. comes in today for New patient  Health Care visit  Previous PCP  At [facility] .  Sees dr Juliene Pina.    Thyroid :    Initially had  Evaluation  From  Allergist  Hives    Dx hypothyroid .   No specific allergen dx  nnot allergic to meat .... Saw endocrinologist put on synthroid      And then had hand   Joint swelling  Had and  stiffness and feeling bad  and   Other  Odd issues .    Has mirena  And finally in desperation went to  [place] and had diet  Changes  food issue . changed synthroid 125 ot current prep   Diary  free and gluten free   Hands felt better in 2 weeks     Still ache at times but not swollen    Blood tests done at pcp include rf anticcp ana   Not aware of other tests  Offered rheum consult  Also exercise s  In the interim .       Sis had celiac positive  Bowel biopsy.  Just recently    Father  Thyroid.   On armour thyroid.   Last labs thyroid was ok? To repeat in  Another month   GI  Constipation .  As a kid  No ibs dx    Taking Zyrtec and pepcid for hive    ADHD  On adderall  :Since   Age 43- 72 . Except for preg    Medication  School when younger was ok ( mom Brewing technologist)   Problem in college .    Ion and out twice until finished in  Music and sociology  Seen on campus.   ? Some LD.  Never did accomodation.  .     Med helps   Socially  Mom and  Husband .   Lisa  Pulhlous.    Family can tell when off med  And notes improvement infocus Health Maintenance  Topic Date Due  . HIV Screening  08/24/1992  . INFLUENZA VACCINE  06/13/2016 (Originally 05/23/2015)  . TETANUS/TDAP  10/22/2020   Health Maintenance Review LIFESTYLE:  Exercise:  Swim other Tobacco/ETS:no Alcohol: no Sugar  beverages: Sleep: 8 hours Drug use: no ast pap feb 2016  ROS: see hpi  GEN/ HEENT: No fever, significant weight changes sweats headaches vision problems hearing changes, CV/ PULM; No chest pain shortness of breath cough, syncope,edema  change in exercise tolerance. GI /GU: No adominal pain, vomiting, change in bowel habits. No blood in the stool. No significant GU symptoms. SKIN/HEME: ,no acute skin rashes suspicious lesions or bleeding. No lymphadenopathy, nodules, masses.  NEURO/ PSYCH:  No neurologic signs such as weakness numbness. No depression anxiety. IMM/ Allergy: No unusual infections.  Allergy  Hives .   REST of 12 system review negative except as per HPI   Past Medical History  Diagnosis Date  . Abnormal Pap smear   . Hx of varicella   . Seasonal allergies   . Hypothyroid     Past Surgical History  Procedure Laterality Date  . Wisdom tooth extraction      Family  History  Problem Relation Age of Onset  . Arthritis Maternal Grandmother   . Hyperlipidemia Maternal Grandfather   . Heart disease Maternal Grandfather   . Hypertension Maternal Grandfather   . Thyroid disease Father   . Cancer - Other Mother     neck ln and salivary?   . Celiac disease Sister     pos biopsy.   . Thyroid disease Sister     Social History   Social History  . Marital Status: Married    Spouse Name: N/A  . Number of Children: N/A  . Years of Education: N/A   Social History Main Topics  . Smoking status: Never Smoker   . Smokeless tobacco: Never Used  . Alcohol Use: No  . Drug Use: No  . Sexual Activity: Yes   Other Topics Concern  . None   Social History Narrative   8 HOURS OF SLEEP PER NIGHT   LIVES AT HOME WITH HER HUSBAND AND 2 CHILDREN (5 & 3 YRS)   WORKS PART-TIME DOING HAIR   No pets    swim   G2 P2    [employer] music [employer] sociology  [employer]   works as Theme park manager     No outpatient prescriptions prior to visit.   No facility-administered medications prior to visit.   neg fam hx fo ibs uc crohns    EXAM:  BP 150/100 mmHg  Temp(Src) 98.7 F (37.1 C) (Oral)  Ht 5' 5.75" (1.67 m)  Wt 241 lb 12.8 oz (109.68 kg)  BMI 39.33 kg/m2  Body mass index is 39.33 kg/(m^2).  Physical Exam: Vital signs reviewed JJH:ERDE is a well-developed well-nourished alert cooperative    who appearsr stated age in no acute distress.  HEENT: normocephalic atraumatic , Eyes: PERRL EOM's full, conjunctiva clear, Nares: paten,t no deformity discharge or tenderness., Ears: no deformity EAC's clear TMs with normal landmarks. Mouth: clear OP, no lesions, edema.  Moist mucous membranes. Dentition in adequate repair.? Slight stare  NECK: supple without masses, thyroid mass or bruits. CHEST/PULM:  Clear to auscultation and percussion breath sounds equal no wheeze , rales or rhonchi. No chest wall deformities or tenderness. CV: PMI is nondisplaced, S1 S2 no gallops, murmurs, rubs. Peripheral pulses are full without delay.No JVD .  ABDOMEN: Bowel sounds normal nontender  No guard or rebound, no hepato splenomegal no CVA tenderness.Extremtities:  No clubbing cyanosis or edema, no acute joint swelling or redness no focal atrophy NEURO:  Oriented x3, cranial nerves 3-12 appear to be intact, no obvious focal weakness,gait within normal limits SKIN: No acute rashes normal turgor, color, no bruising or petechiae. PSYCH: Oriented, good eye contact, no obvious depression anxiety, cognition and judgment appear normal. Some exess motor acitivy but no tremor  LN: no cervical  adenopathy  Lab Results  Component Value Date   WBC 10.4 10/27/2011   HGB 9.0* 10/27/2011   HCT 28.7* 10/27/2011   PLT 213 10/27/2011   GLUCOSE 74 10/27/2011   ALT 37* 10/27/2011   AST 39* 10/27/2011   NA 136 10/27/2011   K 4.4 10/27/2011   CL 108 10/27/2011   CREATININE 0.75 10/27/2011   BUN 8 10/27/2011   CO2 24 10/27/2011    ASSESSMENT AND PLAN:  Discussed the following assessment and plan:  Joint pain -  Better on gluten-free dairy free diet. - Plan: Celiac panel 10, Ferritin, IBC panel, HLA typing for celiac disease, Hepatitis C antibody  Family history of celiac disease - Plan: Celiac panel 10, Ferritin,  IBC panel, HLA typing for celiac disease  Hives - allergy eval poss from thyroid disease - Plan: Celiac panel 10, Ferritin, IBC panel, HLA typing for celiac disease  Thyroid disease - Plan: Celiac panel 10, Ferritin, IBC panel, HLA typing for celiac disease  Elevated BP - says usu nl and goes up in office will check out of office  to make sure in range  Hypothyroidism, unspecified hypothyroidism type - Suspect autoimmune don't have the lab results for antibodies  Influenza vaccination declined Interesting history with family history strong of thyroid disease and now her sister with documented celiac disease. She has a picture of her hands when they were swollen almost like sausage digits but she doesn't have psoriasis and seems to have responded to dietary changes. She works with her hands on a regular basis and may have a little arthritis but sounds like more was going on then overuse. Patient Care Team: Burnis Medin, MD as PCP - General (Internal Medicine) Dian Queen, MD as Consulting Physician (Obstetrics and Gynecology) Noemi Chapel, NP as Nurse Practitioner Patient Instructions  Get [place] a copy of  Your labs   And ov .   From [facility].  For now.  Genetic test for celiac, celiac 10 panel, ferritin , ibc level,  Consideration of seeing Gi for a firm diagnosis as possible .  Your blood pressure is up today but could be from stress of ov. Take blood pressure readings twice a day for - 10 - 14 days and then periodically .To ensure below 140/90   .Send in readings      Gluten-Free Diet for Celiac Disease Gluten is a protein found in wheat, rye, barley, and triticale (a cross between wheat and rye) grains. People with celiac disease need to have a gluten-free diet. With  celiac disease, gluten interferes with the absorption of food and may also cause intestinal injury.  Strict compliance is important even during symptom-free periods. This means eliminating all foods with gluten from your diet permanently. This requires some significant changes but is very manageable. WHAT DO I NEED TO KNOW ABOUT A GLUTEN-FREE DIET?  Look for items labeled with "GF." Looking for GF will make it easier to identify products that are safe to eat.  Read all labels. Gluten may have been added as a minor ingredient where least expected, such as in shredded cheeses or ice creams. Always check food labels and investigate questionable ingredients. Talk to your dietitian or health care provider if you have questions about certain foods or need help finding GF foods.  Check when in doubt. If you are not sure whether an ingredient contains gluten, check with the manufacturer. Note that some manufacturers may change ingredients without notice. Always read labels.   Know how food is prepared. Since flour and cereal products are often used in the preparation of foods, it is important to be aware of the methods of preparation used, as well as the ingredients in the foods themselves. This is especially true when you are dining out. Ask restaurants if they have a gluten-free menu.  Watch for cross-contamination. Cross-contamination occurs when gluten-free foods come into contact with foods that contain gluten. It often happens during the manufacturing process. Always check the ingredient list and for warnings on packages, such as "may contain gluten."  Eat a balanced diet. It is important to still get enough fiber, iron, and B vitamins in your diet. Look for enriched whole grain gluten-free products and continue to eat  a well-balanced diet of the important non-grain items, such as vegetables, fruit, lean proteins, legumes, and dairy.  Consider taking a gluten-free multivitamin and mineral supplement.  Discuss this with your health care provider. WHAT KEY WORDS HELP IDENTIFY GLUTEN? Know key words to help identify gluten. A dietitian can help you identify possible harmful ingredients in the foods you normally eat. Words to check for on food labels include:   Flour, enriched flour, bromated flour, white flour, durum flour, graham flour, phosphated flour, self-rising flour, semolina, or farina.  Starch, dextrin, modified food starch, or cereal.  Thickening, fillers, or emulsifiers.  Any kind of malt flavoring, extract, or syrup (malt is made from barley and includes malt vinegar, malted milk, and malted beverages).  Hydrolyzed vegetable protein. WHAT FOODS CAN I EAT? Below is a list of common foods that are allowed with a gluten-free diet.  Grains Products made from the following flours or grains:amaranth,bean flours, 100% buckwheat flour, corn, millet, nut flours or meals, GF oats, quinoa, rice, sorghum, teff, any all-purpose 100% GF flour mix, rice wafers, pure cornmeal tortillas, popcorn, some crackers, some chips, and hot cereals made from cornmeal. Ask your dietitian which specific hot and cold cereals are allowed. Hominy, rice or wild rice, and special GF pasta. Some Asian rice noodles or bean noodles. Arrowroot starch, corn bran, corn flour, corn germ, cornmeal, corn starch, potato flour, potato starch flour, and rice bran. Rice flours: plain, brown, and sweet. Rice polish, soy flour, tapioca starch. Vegetables All plain, fresh, frozen, or canned vegetables.  Fruits All fresh, frozen, canned, dried fruits, and fruit juices.  Meats and Other Protein Foods Meat, fish, poultry, or eggs prepared without added wheat, rye, barley, or triticale. Some luncheon meat and some frankfurters. Pure meat. All aged cheese, most processed cheese products, some cottage cheese, and some cream cheese. Dried beans, dried peas, and lentils.  Dairy Milk and yogurt made with allowed ingredients.   Beverages Coffee (regular or decaffeinated), tea, herbal tea (read label to be sure that no wheat flour has been added). Carbonated beverages and some root beers. Wine, sake, and distilled spirits, such as gin, vodka, and whiskey. GF beers and GF ciders.  Sweetsand Desserts Sugar, honey, some syrups, molasses, jelly, jam, plain hard candy, marshmallows, gumdrops, homemade candies free of wheat, rye, barley, or triticale. Coconut. Custard, some pudding mixes, and homemade puddings from cornstarch, rice, and tapioca. Gelatin desserts, sorbets, frozen ice pops, and sherbet. Cake, cookies, and other desserts prepared with allowed flours. Some commercial ice creams. Ask your dietitian about specific brands of dessert that are allowed.  Fats and Oils Butter, margarine, vegetable oil, sour cream not containing modified food starch, whipping cream, shortening, lard, cream, and some mayonnaise. Some commercial salad dressings. Peanut butter.  Other Homemade broth and soups made with allowed ingredients; some canned or frozen soups. Any other combination or prepared foods that do not contain gluten. Monosodium glutamate (MSG). Cider, rice, and wine vinegar. Baking soda and baking powder. Certain soy sauces (Tamari). Ask your dietitian about specific brands that are allowed. Nuts, coconut, chocolate, and pure cocoa powder. Salt, pepper, herbs, spices, extracts, and food colorings. The items listed above may not be a complete list of allowed foods or beverages. Contact your dietitian for more options.  WHAT FOODS CAN I NOT EAT? Below is a list of common foods that are not allowed with a gluten-free diet.  Grains Barley, bran, bulgur, cracked wheat, graham, malt, matzo, wheat germ, and all wheat and rye cereals  including spelt and kamut. Avoid cereals containing malt as a flavoring, such as rice cereal. Also avoid regular noodles, spaghetti, macaroni, and most packaged rice mixes, and all others containing  wheat, rye, barley, or triticale.  Vegetables Most creamed vegetables, most vegetables canned in sauces, and any vegetables prepared with wheat, rye, barley, or triticale.  Fruits Thickened or prepared fruits and some pie fillings.  Meats and Other Protein Sources Any meat or meat alternative containing wheat, rye, barley, or gluten stabilizers (such as some hot dogs, salami, cold cuts, or sausage). Bread-containing products, such as Swiss steak, croquettes, and meatloaf. Most tuna canned in vegetable broth, [place] with hydrolyzed vegetable protein (HVP) injected as part of the basting, and any cheese product containing oat gum as an ingredient. Seitan. Imitation fish. Dairy Commercial chocolate milk, which may have cereal added, and malted milk. Beverages Certain cereal beverages. Beer and ciders (unless GF), ale, malted milk, and some root beers. Sweetsand Desserts Commercial candies containing wheat, rye, barley, or triticale. Certain toffees are dusted with wheat flour. Chocolate-coated nuts, which are often rolled in flour. Cakes, cookies, doughnuts, and pastries that are prepared with wheat, barley, rye, or triticale flour. Some commercial ice creams, ice cream flavors which contain cookies, crumbs, or cheesecake. Ice cream cones. Commercially prepared mixes for cakes, cookies, and other desserts unless marked GF. Bread pudding and other puddings thickened with flour. Fats and Oils Some commercial salad dressings and sour cream containing modified food starch.  Condiments Some curry powder, some dry seasoning mixes, some gravy extracts, some meat sauces, some ketchup, some prepared mustard, horseradish. Other All soups containing wheat, rye, barley, or triticale flour. Bouillon and bouillon cubes that contain HVP. Combination or prepared foods that contain gluten. Some soy sauce, some chip dips, and some chewing gum. Yeast extract (contains barley). Caramel color (may contain  malt). The items listed above may not be a complete list of foods and beverages to avoid. Contact your dietitian for more information. Document Released: 10/08/2005 Document Revised: 02/22/2014 Document Reviewed: 08/12/2013 [facility] Patient Information 2015 [place], [place]. This information is not intended to replace advice given to you by your health care provider. Make sure you discuss any questions you have with your health care provider. Celiac Disease Celiac disease is a digestive disease that causes your body's natural defense system (immune system) to react against its own cells. It interferes with taking in (absorbing) nutrients from food. Celiac disease is also known as celiac sprue, nontropical sprue, and gluten-sensitive enteropathy. People who have celiac disease cannot tolerate gluten. Gluten is a protein found in wheat, rye, and barley. With time, celiac disease will damage the cells lining the small intestine. This leads to being unable to absorb nutrients from food (malabsorption), diarrhea, and nutritional problems. CAUSES  Celiac disease is genetic. This means you have a higher likelihood of getting the disease if someone in your family has or has had it. Up to 10% of your close relatives (parent, sibling, child) may also have the disease.  People with celiac disease tend to have other autoimmune diseases. The link may be genetic. These diseases include:  Dermatitis herpetiformis.  Thyroid disease.  Systemic lupus erythematosus.  Type 1 diabetes.  Liver disease.  Collagen vascular disease.  Rheumatoid arthritis.  Sjogren syndrome. SYMPTOMS  The symptoms of celiac disease vary from person to person. The symptoms are generally digestive or nutritional. Digestive symptoms include:  Recurring belly (abdominal) bloating and pain.  Gas.  Long-term (chronic) diarrhea.  Pale, bad-smelling, greasy,  or oily stool. Nutritional symptoms include:  Failure to thrive in  infants.  Delayed growth in children.  Weight loss in children and adults.  Missed menstrual periods (often due to extreme weight loss).  Anemia.  Weakening bones (osteoporosis).  Fatigue and weakness.  Tingling or other signs of nerve damage (peripheral neuropathy).  Depression. DIAGNOSIS  If your symptoms and physical exam suggest that a digestive disorder or malnutrition is present, your caregiver may suspect celiac disease. You may have already begun a gluten-free diet. If symptoms persist, testing may be needed to confirm the diagnosis. Some tests are best done while you are on a normal, unrestricted diet. Tests may include:  Blood tests to check for nutritional deficiencies.  Blood tests to look for evidence that the body is producing antibodies against its own small intestine cells.  Taking a tissue sample (biopsy) from the small bowel for evaluation.  X-rays of the small bowel.  Evaluating the stool for fat.  Tests to check for nutrient absorption from the intestine. TREATMENT  It is important to seek treatment. Untreated celiac disease can cause growth problems (in children), anemia, osteoporosis, and possible nerve problems. A pregnant patient with untreated celiac disease has a higher risk of miscarriage, and the fetus has an increased risk of low birth weight and other growth problems. If celiac disease is diagnosed in the early stages, treatment can allow you to live a long, nearly symptom-free life. Treatment includes following a gluten-free diet. This means avoiding all foods that contain gluten. Eating even a small amount of gluten can damage your intestine. For most people, following this diet will stop symptoms. It will heal existing intestinal damage and prevent further damage. Improvements begin within days of starting the diet. The small intestine is usually completely healed within 3 to 6 months, or it may take up to 2 years for older adults. A small percentage  of people do not improve on the gluten-free diet. Depending on your age and the stage at which you were diagnosed, some problems such as delayed growth and discolored teeth may not improve. Sometimes, damaged intestines cannot heal. If your intestines are not absorbing enough nutrients, you may need to receive nutrition supplements through an intravenous (IV) tube. Drug treatments are being tested for unresponsive celiac disease. In this case, you may need to be evaluated for complications of the disease. Your caregiver may also recommend:  A pneumonia vaccination.  Nutrients and other treatments for any nutritional deficiencies. Your caregiver can provide you with more information on a gluten-free diet. Discussion with a dietitian skilled in this illness will be valuable. Support groups may also be helpful. HOME CARE INSTRUCTIONS   Focus on a gluten-free diet. This diet must become a way of life.  Monitor your response to the gluten-free diet and treat any nutritional deficiencies.  Prepare ahead of time if you decide to eat outside the home.  Make and keep your regular follow-up visits with your caregiver.  Suggest to family members that they get screened for early signs of the disease. SEEK MEDICAL CARE IF:   You continue to have digestive symptoms (gas, cramping, diarrhea) despite a proper diet.  You have trouble sticking to the gluten-free diet.  You develop an itchy rash with groups of tiny blisters.  You develop severe weakness, balance problems, menstrual problems, or depression. Document Released: 10/08/2005 Document Revised: 02/22/2014 Document Reviewed: 01/25/2010 [facility] Patient Information 2015 [place], [place]. This information is not intended to replace advice given to you  by your health care provider. Make sure you discuss any questions you have with your health care provider.      Standley Brooking. Panosh M.D.

## 2015-06-16 LAB — HEPATITIS C ANTIBODY: HCV AB: NEGATIVE

## 2015-06-17 LAB — HLA TYPING FOR CELIAC DISEASE
HLA-DQ2 (DQA1 05/DQB1 02): POSITIVE — AB
HLA-DQ8 (DQA1 03/DQB 0302): POSITIVE — AB

## 2015-06-20 LAB — CELIAC PANEL 10
Endomysial Screen: NEGATIVE
Gliadin IgA: 4 Units (ref <20)
Gliadin IgG: 2 Units (ref <20)
TISSUE TRANSGLUT AB: 3 U/mL (ref <6)
Tissue Transglutaminase Ab, IgA: 1 U/mL (ref <4)

## 2015-06-22 ENCOUNTER — Telehealth: Payer: Self-pay | Admitting: Family Medicine

## 2015-06-22 ENCOUNTER — Encounter: Payer: Self-pay | Admitting: Internal Medicine

## 2015-06-22 DIAGNOSIS — R768 Other specified abnormal immunological findings in serum: Secondary | ICD-10-CM

## 2015-06-22 NOTE — Telephone Encounter (Signed)
Annice Pih from [facility] called to report that there was not enough specimen to perform the IGA that was ordered.  Please advise.  Thanks!

## 2015-06-22 NOTE — Telephone Encounter (Signed)
GI referral for celiac?

## 2015-06-22 NOTE — Telephone Encounter (Signed)
That is disappointing . Let patient know   If she want to  Redraw at no charge to her    Please have the agency not charge  For the IGA part of the panel.  If not  It isnt curcial but good to have .  Would like her to see GI see  Result note.

## 2015-06-29 ENCOUNTER — Encounter: Payer: Self-pay | Admitting: Physician Assistant

## 2015-07-04 NOTE — Telephone Encounter (Signed)
Left a message on home/cell for a return call. 

## 2015-07-04 NOTE — Telephone Encounter (Signed)
Spoke to the pt.  She has an appt with gastro on 07/18/15.  She will discuss lab work with gastro. If this test is needed than we can wipe the charges if she comes here.

## 2015-07-18 ENCOUNTER — Encounter: Payer: Self-pay | Admitting: Physician Assistant

## 2015-07-18 ENCOUNTER — Ambulatory Visit (INDEPENDENT_AMBULATORY_CARE_PROVIDER_SITE_OTHER): Payer: BLUE CROSS/BLUE SHIELD | Admitting: Physician Assistant

## 2015-07-18 VITALS — BP 118/70 | HR 84 | Ht 66.0 in | Wt 248.0 lb

## 2015-07-18 DIAGNOSIS — K900 Celiac disease: Secondary | ICD-10-CM

## 2015-07-18 NOTE — Patient Instructions (Signed)
Follow up as needed with Mike Gip PA or Dr. Erick Blinks.

## 2015-07-18 NOTE — Progress Notes (Addendum)
Patient ID: Katherine Dougherty, female   DOB: Aug 14, 1976, 39 y.o.   MRN: [id number]   Subjective:    Patient ID: Katherine Dougherty, female    DOB: [birth year], 39 y.o.   MRN: [id number]  HPI [place] is a pleasant 39 year old white female referred today by Dr. Regis Bill for evaluation of probable celiac disease. Patient has positive genetic markers with positive HLA DQ 2 and positive HLA DQ 8, but had recent celiac panel which was negative.'s was done in the setting of long-term gluten-free diet. She also had iron studies done showing an iron of 66 TIBC 295 iron sat of 16 and ferritin of 45. Patient has a sister with recently biopsy confirmed celiac disease. She says she had been having problems with her thyroid and joint pain over the past year and a half. She was being seen at in integrative medicine practice and also underwent food allergy testing which was positive for allergy to wheat, barley, buckwheat, [place] ,Rye,and amarinth She also has a dairy allergy. She had gone completely dairy and gluten free over the past year and had immediate improvement in her joint pain and inflammation which had affected her fingers and ankles. She also had been having intermittent abdominal discomfort and diarrhea. She says she feels much better. Now if she consumes any gluten she will know immediately because within an hour she will develop some joint pain abdominal crampy discomfort and diarrhea. As far as dairy is concerned she had developed a pruritic rash with any consumption over the past couple of years. Her son also has a dairy allergy. Patient is currently feeling well and is completely gluten-free.  Review of Systems Pertinent positive and negative review of systems were noted in the above HPI section.  All other review of systems was otherwise negative.  Outpatient Encounter Prescriptions as of 07/18/2015  Medication Sig  . ADDERALL XR 30 MG 24 hr capsule Take 1 tablet by mouth daily.  . cetirizine (ZYRTEC) 10 MG tablet  Take 10 mg by mouth daily.  . Cholecalciferol (VITAMIN D3) 1000 UNITS CAPS Take 1 capsule by mouth daily.  . famotidine (PEPCID) 10 MG tablet Take 10 mg by mouth daily.  . ferrous sulfate 325 (65 FE) MG tablet Take 325 mg by mouth daily with breakfast.  . levonorgestrel (MIRENA) 20 MCG/24HR IUD 1 each by Intrauterine route once.  Marland Kitchen levothyroxine (SYNTHROID) 125 MCG tablet Take 125 mcg by mouth daily before breakfast.  . NATURE-THROID 81.25 MG TABS Take 1 tablet by mouth 2 (two) times daily.  Marland Kitchen OVER THE COUNTER MEDICATION METHYLATION COMPLETE  . Zinc 50 MG CAPS Take by mouth. 1 capsule three times weekly   No facility-administered encounter medications on file as of 07/18/2015.   Allergies  Allergen Reactions  . Sulfa Antibiotics Other (See Comments)    Childhood allergy; reaction unknown   Patient Active Problem List   Diagnosis Date Noted  . Hives 06/15/2015  . Thyroid disease 06/15/2015  . Thyroid activity decreased 06/15/2015  . Influenza vaccination declined 06/15/2015  . Family history of celiac disease 06/15/2015   Social History   Social History  . Marital Status: Married    Spouse Name: N/A  . Number of Children: 2  . Years of Education: N/A   Occupational History  . Hairstylist    Social History Main Topics  . Smoking status: Never Smoker   . Smokeless tobacco: Never Used  . Alcohol Use: No  . Drug Use: No  . Sexual Activity: Yes  Other Topics Concern  . Not on file   Social History Narrative   8 HOURS OF SLEEP PER NIGHT   LIVES AT HOME WITH HER HUSBAND AND 2 CHILDREN (5 & 3 YRS)   WORKS PART-TIME DOING HAIR   No pets    swim   G2 P2    [employer]  [employer]   works as Theme park manager     Ms. Mccrystal's family history includes Arthritis in her maternal grandmother; Cancer - Other in her mother; Celiac disease in her sister; Heart disease in her maternal grandfather; Hyperlipidemia in her maternal grandfather; Hypertension in her maternal grandfather;  Thyroid disease in her father and sister.      Objective:    Filed Vitals:   07/18/15 0837  BP: 118/70  Pulse: 84    Physical Exam  well-developed young white female in no acute distress, accompanied by her daughter, blood pressure 118/70 pulse 84 height 5 foot 6 weight 248. HEENT ;nontraumatic, cephalic EOMI PERRLA sclera anicteric, Cardiovascular; regular rate and rhythm with S1-S2 no murmur rub or gallop, Pulmonary; clear bilaterally, Abdomen; soft nontender nondistended bowel sounds are active there is no palpable mass or hepatosplenomegaly, Rectal; exam not done, Ext; no clubbing cyanosis or edema skin warm and dry no rash, Neuropsych; mood and affect appropriate       Assessment & Plan:   #1 39 yo female with high probability of celiac disease manifested by joint pain/inflammation,abdominal pain and diarrhea +family hx in sibling + HLA markers, negative celiac panel(Done while gluten free x many months) + food allergy testing  For wheat,barley, rye ,amarinth Will manage as celiac disease  Plan; Pt is very gluten sensitive with immediate sxs with consumption , and follows a strict gluten free diet EGD with bx could be done for confirmation but she will be miserable if asked to consume gluten for any period of time prior to procedure. Do not believe + bx  Would change our management at this time. Have advised she continue strict gluten free diet, add gluten free multivit with iron, already taking Vit D. She will follow up on an as needed basis- happy to see her for any problems .   Amy S Esterwood PA-C 07/18/2015   Cc: Burnis Medin, MD  Addendum: Reviewed and agree with initial management. Jerene Bears, MD

## 2015-08-15 ENCOUNTER — Ambulatory Visit (INDEPENDENT_AMBULATORY_CARE_PROVIDER_SITE_OTHER): Payer: BLUE CROSS/BLUE SHIELD | Admitting: Internal Medicine

## 2015-08-15 NOTE — Progress Notes (Signed)
Document opened and reviewed for ROV visit . No showed .

## 2015-09-27 ENCOUNTER — Encounter: Payer: Self-pay | Admitting: Internal Medicine

## 2015-09-27 ENCOUNTER — Ambulatory Visit (INDEPENDENT_AMBULATORY_CARE_PROVIDER_SITE_OTHER): Payer: BLUE CROSS/BLUE SHIELD | Admitting: Internal Medicine

## 2015-09-27 VITALS — BP 122/88 | Temp 98.3°F | Wt 249.9 lb

## 2015-09-27 DIAGNOSIS — R030 Elevated blood-pressure reading, without diagnosis of hypertension: Secondary | ICD-10-CM | POA: Diagnosis not present

## 2015-09-27 DIAGNOSIS — E039 Hypothyroidism, unspecified: Secondary | ICD-10-CM

## 2015-09-27 DIAGNOSIS — IMO0001 Reserved for inherently not codable concepts without codable children: Secondary | ICD-10-CM

## 2015-09-27 DIAGNOSIS — K900 Celiac disease: Secondary | ICD-10-CM

## 2015-09-27 DIAGNOSIS — K9041 Non-celiac gluten sensitivity: Secondary | ICD-10-CM

## 2015-09-27 DIAGNOSIS — Z8379 Family history of other diseases of the digestive system: Secondary | ICD-10-CM

## 2015-09-27 DIAGNOSIS — E611 Iron deficiency: Secondary | ICD-10-CM

## 2015-09-27 NOTE — Patient Instructions (Signed)
Please get us copy   Of recent  Blood tests  Including  Thyroid .   Get us  Home BP readings for the record to make sure  Documented at goal.   At this time    Continue   As presumed  Celiac although not  Confirmed dx.  Let us know if need nutritional referral.   Otherwise  Wellness visit  Next year .

## 2015-09-27 NOTE — Progress Notes (Signed)
Pre visit review using our clinic review tool, if applicable. No additional management support is needed unless otherwise documented below in the visit note.  Chief Complaint  Patient presents with  . Follow-up    Follow up from GI visit.    HPI: Katherine Dougherty 39 y.o. comes in (missed appt in October ) Has had gi sx better on gluten free diet and gi evaluated and felt to delay bx or now and rx as if celiac  Based on hx and pos genetics and family hx   And response to  Diet change  ADHD rx per dr Dallie Dad and takes   Most days family notices when not taking  bp elevaetion at last appt  Feels WC sis says better when at home 118 range   Taking irpon  Once a day.   Says last check was better  Thyroid now on low dose synthroid and then naturthroid  feeling better  Last labs 2 months ago  ( not in record)   ROS: See pertinent positives and negatives per HPI. No cp sob exercise intolerance tremor  Psych issues  Past Medical History  Diagnosis Date  . Abnormal Pap smear   . Hx of varicella   . Seasonal allergies   . Hypothyroid   . Abnormal celiac antibody panel   . Iron deficiency anemia     Family History  Problem Relation Age of Onset  . Arthritis Maternal Grandmother   . Hyperlipidemia Maternal Grandfather   . Heart disease Maternal Grandfather   . Hypertension Maternal Grandfather   . Thyroid disease Father   . Cancer - Other Mother     neck ln and salivary?   . Celiac disease Sister     pos biopsy.   . Thyroid disease Sister     Social History   Social History  . Marital Status: Married    Spouse Name: N/A  . Number of Children: 2  . Years of Education: N/A   Occupational History  . Hairstylist    Social History Main Topics  . Smoking status: Never Smoker   . Smokeless tobacco: Never Used  . Alcohol Use: No  . Drug Use: No  . Sexual Activity: Yes   Other Topics Concern  . Not on file   Social History Narrative   8 HOURS OF SLEEP PER NIGHT   LIVES AT HOME  WITH HER HUSBAND AND 2 CHILDREN (5 & 3 YRS)   WORKS PART-TIME DOING HAIR   No pets    swim   G2 P2    [employer]  [employer]      works as Interior and spatial designer     Outpatient Prescriptions Prior to Visit  Medication Sig Dispense Refill  . ADDERALL XR 30 MG 24 hr capsule Take 1 tablet by mouth daily.  0  . cetirizine (ZYRTEC) 10 MG tablet Take 10 mg by mouth daily.    . Cholecalciferol (VITAMIN D3) 1000 UNITS CAPS Take 1 capsule by mouth daily.    . ferrous sulfate 325 (65 FE) MG tablet Take 325 mg by mouth daily with breakfast.    . levonorgestrel (MIRENA) 20 MCG/24HR IUD 1 each by Intrauterine route once.    Marland Kitchen NATURE-THROID 81.25 MG TABS Take 1 tablet by mouth 2 (two) times daily.  1  . OVER THE COUNTER MEDICATION METHYLATION COMPLETE    . Zinc 50 MG CAPS Take by mouth. 1 capsule three times weekly    . famotidine (PEPCID) 10 MG  tablet Take 10 mg by mouth daily.    Marland Kitchen. levothyroxine (SYNTHROID) 125 MCG tablet Take 125 mcg by mouth daily before breakfast.     No facility-administered medications prior to visit.     EXAM:  BP 132/100 mmHg  Temp(Src) 98.3 F (36.8 C) (Oral)  Wt 249 lb 14.4 oz (113.354 kg)  Body mass index is 40.35 kg/(m^2). bp repeat 122/88 90 GENERAL: vitals reviewed and listed above, alert, oriented, appears well hydrated and in no acute distress HEENT: atraumatic, conjunctiva  clear, no obvious abnormalities on inspection of external nose and ears  NECK: no obvious masses on inspection palpation thyroid palpable   CV: HRRR, no clubbing cyanosis or  peripheral edema nl cap refill  No tremor  MS: moves all extremities without noticeable focal  abnormality PSYCH: pleasant and cooperative, no obvious depression or anxiety Lab Results  Component Value Date   WBC 10.4 10/27/2011   HGB 9.0* 10/27/2011   HCT 28.7* 10/27/2011   PLT 213 10/27/2011   GLUCOSE 74 10/27/2011   ALT 37* 10/27/2011   AST 39* 10/27/2011   NA 136 10/27/2011   K 4.4 10/27/2011   CL 108  10/27/2011   CREATININE 0.75 10/27/2011   BUN 8 10/27/2011   CO2 24 10/27/2011   BP Readings from Last 3 Encounters:  09/27/15 132/100  07/18/15 118/70  06/15/15 150/100   Wt Readings from Last 3 Encounters:  09/27/15 249 lb 14.4 oz (113.354 kg)  07/18/15 248 lb (112.492 kg)  06/15/15 241 lb 12.8 oz (109.68 kg)     ASSESSMENT AND PLAN:  Discussed the following assessment and plan:  Hypothyroidism, unspecified hypothyroidism type  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 Labs     Last tsh  Was normal  -Patient advised to return or notify health care team  if symptoms worsen ,persist or new concerns arise.  Patient Instructions  Please get us copy   Of recent  Blood tests  Including  Thyroid .   Get us  Home BP readings for the record to make sure  Documented at goal.   At this time    Continue   As presumed  Celiac although not  Confirmed dx.  Let us know if need nutritional referral.   Otherwise  Wellness visit  Next year .          Neta MendsWanda K. Panosh M.D.

## 2015-09-27 NOTE — Assessment & Plan Note (Signed)
Seen in  integrative  [place]  Now on low dose synthroid  and nature throid  feeling  Better  Get copy last labs for our records

## 2016-02-21 ENCOUNTER — Other Ambulatory Visit: Payer: BLUE CROSS/BLUE SHIELD

## 2016-02-27 ENCOUNTER — Encounter: Payer: BLUE CROSS/BLUE SHIELD | Admitting: Internal Medicine

## 2016-09-06 ENCOUNTER — Emergency Department (HOSPITAL_COMMUNITY): Payer: BLUE CROSS/BLUE SHIELD

## 2016-09-06 ENCOUNTER — Encounter (HOSPITAL_COMMUNITY): Payer: Self-pay | Admitting: Emergency Medicine

## 2016-09-06 ENCOUNTER — Emergency Department (HOSPITAL_COMMUNITY)
Admission: EM | Admit: 2016-09-06 | Discharge: 2016-09-06 | Disposition: A | Discharge status: Home / Self Care | Payer: BLUE CROSS/BLUE SHIELD | Attending: Emergency Medicine | Admitting: Emergency Medicine

## 2016-09-06 DIAGNOSIS — R1011 Right upper quadrant pain: Secondary | ICD-10-CM | POA: Diagnosis present

## 2016-09-06 DIAGNOSIS — K8050 Calculus of bile duct without cholangitis or cholecystitis without obstruction: Secondary | ICD-10-CM

## 2016-09-06 DIAGNOSIS — E039 Hypothyroidism, unspecified: Secondary | ICD-10-CM | POA: Diagnosis not present

## 2016-09-06 DIAGNOSIS — K8020 Calculus of gallbladder without cholecystitis without obstruction: Secondary | ICD-10-CM | POA: Diagnosis not present

## 2016-09-06 DIAGNOSIS — K8080 Other cholelithiasis without obstruction: Secondary | ICD-10-CM

## 2016-09-06 DIAGNOSIS — Z7982 Long term (current) use of aspirin: Secondary | ICD-10-CM | POA: Diagnosis not present

## 2016-09-06 LAB — COMPREHENSIVE METABOLIC PANEL
ALK PHOS: 52 U/L (ref 38–126)
ALT: 17 U/L (ref 14–54)
AST: 22 U/L (ref 15–41)
Albumin: 4.4 g/dL (ref 3.5–5.0)
Anion gap: 8 (ref 5–15)
BUN: 8 mg/dL (ref 6–20)
CALCIUM: 9.9 mg/dL (ref 8.9–10.3)
CHLORIDE: 104 mmol/L (ref 101–111)
CO2: 25 mmol/L (ref 22–32)
CREATININE: 0.83 mg/dL (ref 0.44–1.00)
Glucose, Bld: 99 mg/dL (ref 65–99)
Potassium: 4.2 mmol/L (ref 3.5–5.1)
Sodium: 137 mmol/L (ref 135–145)
Total Bilirubin: 0.4 mg/dL (ref 0.3–1.2)
Total Protein: 7.8 g/dL (ref 6.5–8.1)

## 2016-09-06 LAB — URINALYSIS, ROUTINE W REFLEX MICROSCOPIC
Bilirubin Urine: NEGATIVE
GLUCOSE, UA: NEGATIVE mg/dL
HGB URINE DIPSTICK: NEGATIVE
KETONES UR: NEGATIVE mg/dL
LEUKOCYTES UA: NEGATIVE
Nitrite: NEGATIVE
PROTEIN: NEGATIVE mg/dL
Specific Gravity, Urine: 1.023 (ref 1.005–1.030)
pH: 5 (ref 5.0–8.0)

## 2016-09-06 LAB — CBC
HCT: 44.2 % (ref 36.0–46.0)
Hemoglobin: 15 g/dL (ref 12.0–15.0)
MCH: 29.5 pg (ref 26.0–34.0)
MCHC: 33.9 g/dL (ref 30.0–36.0)
MCV: 87 fL (ref 78.0–100.0)
PLATELETS: 277 10*3/uL (ref 150–400)
RBC: 5.08 MIL/uL (ref 3.87–5.11)
RDW: 13 % (ref 11.5–15.5)
WBC: 10.8 10*3/uL — AB (ref 4.0–10.5)

## 2016-09-06 LAB — LIPASE, BLOOD: LIPASE: 26 U/L (ref 11–51)

## 2016-09-06 LAB — PREGNANCY, URINE: Preg Test, Ur: NEGATIVE

## 2016-09-06 MED ORDER — MORPHINE SULFATE (PF) 4 MG/ML IV SOLN
4.0000 mg | Freq: Once | INTRAVENOUS | Status: AC
  Administered 2016-09-06: 4 mg via INTRAVENOUS
  Filled 2016-09-06: qty 1

## 2016-09-06 MED ORDER — HYDROCODONE-ACETAMINOPHEN 5-325 MG PO TABS: 2.0000 | ORAL_TABLET | ORAL | 0 refills | Status: DC | PRN

## 2016-09-06 MED ORDER — ONDANSETRON HCL 4 MG/2ML IJ SOLN
4.0000 mg | Freq: Once | INTRAMUSCULAR | Status: AC
  Administered 2016-09-06: 4 mg via INTRAVENOUS
  Filled 2016-09-06: qty 2

## 2016-09-06 MED ORDER — HYDROMORPHONE HCL 2 MG/ML IJ SOLN
1.0000 mg | Freq: Once | INTRAMUSCULAR | Status: AC
  Administered 2016-09-06: 1 mg via INTRAVENOUS
  Filled 2016-09-06: qty 1

## 2016-09-06 NOTE — Discharge Instructions (Signed)
Take pain medication as needed. Avoid fatty or spicy foods. Follow up with your primary care provider as well as surgery for further evaluation. Return to the ED if you experience severe worsening of your symptoms, fever, yellowing of the skin, vomiting.

## 2016-09-06 NOTE — ED Notes (Signed)
Patient transported to Ultrasound 

## 2016-09-06 NOTE — ED Notes (Signed)
Denying pain at this time; no needs verbalized. Family at bedside have no needs.

## 2016-09-06 NOTE — ED Notes (Signed)
Pt currently getting into gown. Family at bedside. Advised pt to call out when ready.

## 2016-09-06 NOTE — ED Notes (Signed)
ED Provider at bedside. 

## 2016-09-06 NOTE — ED Notes (Signed)
Husband to drive her home

## 2016-09-06 NOTE — ED Provider Notes (Signed)
[facility] DEPT Provider Note   CSN: [id number] Arrival date & time: 09/06/16  0037     History   Chief Complaint Chief Complaint  Patient presents with  . Abdominal Pain    HPI Katherine Dougherty is a 40 y.o. female with a past medical history of gluten sensitivity who presents to the ED today complaining of abdominal pain. Patient states that last night for dinner she had a cheese omelette. She went to bed around 10 PM and felt fine at that time. Around 2 AM she woke up with severe, sharp right upper quadrant abdominal pain that is remained constant. She has associated nausea but no vomiting. She reports a similar episode 3 days ago after eating [place]. At that time she has associated vomiting. She states eventually that pain resolved on its own. She is concerned that she is maybe causing her symptoms. She has history of gluten sensitivity but states this typically does cause her to have watery diarrhea and does not normally cause this kind of pain. She denies any fevers but states that she has felt sweaty and cold. She denies any dysuria, hematuria, vaginal bleeding, vaginal discharge. She uses the Mirena for contraception, LMP unknown.  HPI  Past Medical History:  Diagnosis Date  . Abnormal celiac antibody panel   . Abnormal Pap smear   . Hx of varicella   . Hypothyroid   . Iron deficiency anemia   . Seasonal allergies     Patient Active Problem List   Diagnosis Date Noted  . 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 Iron deficiency 09/27/2015  . Hives 06/15/2015  . Thyroid disease 06/15/2015  . Thyroid activity decreased 06/15/2015  . Influenza vaccination declined 06/15/2015  . Family history of celiac disease 06/15/2015    Past Surgical History:  Procedure Laterality Date  . WISDOM TOOTH EXTRACTION      OB History    Gravida Para Term Preterm AB Living   2 2 2     2    SAB TAB Ectopic Multiple Live Births           1         Home Medications    Prior to Admission medications   Medication Sig Start Date End Date Taking? Authorizing Provider  ADDERALL XR 30 MG 24 hr capsule Take 30 mg by mouth daily as needed (for attention).  05/31/15  Yes Historical Provider, MD  aspirin-sod bicarb-citric acid (ALKA-SELTZER) 325 MG TBEF tablet Take 650 mg by mouth every 6 (six) hours as needed (for stomach).   Yes Historical Provider, MD  levonorgestrel (MIRENA) 20 MCG/24HR IUD 1 each by Intrauterine route once.   Yes Historical Provider, MD  NATURE-THROID 81.25 MG TABS Take 81.25 mg by mouth daily.  04/30/15  Yes Historical Provider, MD    Family History Family History  Problem Relation Age of Onset  . Arthritis Maternal Grandmother   . Hyperlipidemia Maternal Grandfather   . Heart disease Maternal Grandfather   . Hypertension Maternal Grandfather   . Thyroid disease Father   . Cancer - Other Mother     neck ln and salivary?   . Celiac disease Sister     pos biopsy.   . Thyroid disease Sister     Social History Social History  Substance Use Topics  . Smoking status: Never Smoker  . Smokeless tobacco: Never Used  . Alcohol use No     Allergies   Sulfa antibiotics   Review  of Systems Review of Systems  All other systems reviewed and are negative.    Physical Exam Updated Vital Signs BP 135/91 (BP Location: Left Arm)   Pulse 81   Temp 98.2 F (36.8 C) (Oral)   Resp 19   SpO2 100%   Physical Exam  Constitutional: She is oriented to person, place, and time. She appears well-developed and well-nourished. No distress.  HENT:  Head: Normocephalic and atraumatic.  Mouth/Throat: No oropharyngeal exudate.  Eyes: Conjunctivae and EOM are normal. Pupils are equal, round, and reactive to light. Right eye exhibits no discharge. Left eye exhibits no discharge. No scleral icterus.  Cardiovascular: Normal rate, regular rhythm, normal heart sounds and intact distal pulses.  Exam reveals no gallop and no  friction rub.   No murmur heard. Pulmonary/Chest: Effort normal and breath sounds normal. No respiratory distress. She has no wheezes. She has no rales. She exhibits no tenderness.  Abdominal: Soft. She exhibits no distension. There is tenderness ( RUQ). There is no guarding.  Positive Murphy's sign  Musculoskeletal: Normal range of motion. She exhibits no edema.  Neurological: She is alert and oriented to person, place, and time.  Skin: Skin is warm and dry. No rash noted. She is not diaphoretic. No erythema. No pallor.  Psychiatric: She has a normal mood and affect. Her behavior is normal.  Nursing note and vitals reviewed.    ED Treatments / Results  Labs (all labs ordered are listed, but only abnormal results are displayed) Labs Reviewed  CBC - Abnormal; Notable for the following:       Result Value   WBC 10.8 (*)    All other components within normal limits  LIPASE, BLOOD  COMPREHENSIVE METABOLIC PANEL  URINALYSIS, ROUTINE W REFLEX MICROSCOPIC (NOT AT [facility])  PREGNANCY, URINE    EKG  EKG Interpretation None       Radiology No results found.  Procedures Procedures (including critical care time)  Medications Ordered in ED Medications  morphine 4 MG/ML injection 4 mg (4 mg Intravenous Given 09/06/16 1233)  ondansetron (ZOFRAN) injection 4 mg (4 mg Intravenous Given 09/06/16 1259)     Initial Impression / Assessment and Plan / ED Course  I have reviewed the triage vital signs and the nursing notes.  Pertinent labs & imaging results that were available during my care of the patient were reviewed by me and considered in my medical decision making (see chart for details).  Clinical Course     40 y.o F presents to the ED today c.o severe RUQ abd pain after eating large cheesy omelet last night. Pt has nausea but no vomiting. Similar episode occurred a few days ago after eating Katie food. On presentation to ED, pt appears uncomfortable due to pain but  is otherwise non-toxic and non-septic appearing. NO fever or jaundice. Positive Murphy's sign. Pt given pain medication with symptomatic relief. Concern for biliary colic vs cholecystitis. Hcg negative. Doubt PID, appendicitis, pyelonephritis, diverticulitis or other acute abdominal etiologies. Lab work unremarkable. No leukocytosis. Normal LFTs, alk phos. RUQ US reveals diffuse hepatic steatosis, gallstones. No GB wall thickening or pericholecystic fluid to suggest cholecystitis. Limited view of the CBD. Symptoms likely related to bilary colic. Discussed findings with pt. Recommend avoid fatty foods. Follow up with surgery for likely cholecystectomy, non emergent. She may also require outpt HIDA scan. Will d/c with few prn pain medications. Return precautions outlined in patient discharge instructions.    Final Clinical Impressions(s) / ED Diagnoses  Final diagnoses:  RUQ pain    New Prescriptions New Prescriptions   No medications on file     Carlos Levering, PA-C 09/10/16 [place], MD 09/10/16 1730

## 2016-09-06 NOTE — ED Triage Notes (Signed)
Pt sts abd pain worse after eating cheese last night

## 2016-09-19 ENCOUNTER — Encounter: Payer: Self-pay | Admitting: Physician Assistant

## 2016-09-19 ENCOUNTER — Ambulatory Visit (INDEPENDENT_AMBULATORY_CARE_PROVIDER_SITE_OTHER): Payer: BLUE CROSS/BLUE SHIELD | Admitting: Physician Assistant

## 2016-09-19 ENCOUNTER — Other Ambulatory Visit (INDEPENDENT_AMBULATORY_CARE_PROVIDER_SITE_OTHER): Payer: BLUE CROSS/BLUE SHIELD

## 2016-09-19 VITALS — BP 134/94 | HR 100 | Ht 66.0 in | Wt 261.0 lb

## 2016-09-19 DIAGNOSIS — K8050 Calculus of bile duct without cholangitis or cholecystitis without obstruction: Secondary | ICD-10-CM

## 2016-09-19 DIAGNOSIS — R1011 Right upper quadrant pain: Secondary | ICD-10-CM

## 2016-09-19 DIAGNOSIS — K8080 Other cholelithiasis without obstruction: Secondary | ICD-10-CM

## 2016-09-19 LAB — COMPREHENSIVE METABOLIC PANEL
ALBUMIN: 4.5 g/dL (ref 3.5–5.2)
ALT: 35 U/L (ref 0–35)
AST: 28 U/L (ref 0–37)
Alkaline Phosphatase: 54 U/L (ref 39–117)
BUN: 13 mg/dL (ref 6–23)
CALCIUM: 9.7 mg/dL (ref 8.4–10.5)
CHLORIDE: 100 meq/L (ref 96–112)
CO2: 28 meq/L (ref 19–32)
Creatinine, Ser: 0.84 mg/dL (ref 0.40–1.20)
GFR: 80.2 mL/min (ref >60.00)
Glucose, Bld: 87 mg/dL (ref 70–99)
POTASSIUM: 4.3 meq/L (ref 3.5–5.1)
SODIUM: 137 meq/L (ref 135–145)
Total Bilirubin: 0.5 mg/dL (ref 0.2–1.2)
Total Protein: 8.1 g/dL (ref 6.0–8.3)

## 2016-09-19 LAB — CBC WITH DIFFERENTIAL/PLATELET
BASOS PCT: 0.4 % (ref 0.0–3.0)
Basophils Absolute: 0 10*3/uL (ref 0.0–0.1)
EOS ABS: 0.3 10*3/uL (ref 0.0–0.7)
EOS PCT: 2.5 % (ref 0.0–5.0)
HEMATOCRIT: 42.1 % (ref 36.0–46.0)
HEMOGLOBIN: 14.3 g/dL (ref 12.0–15.0)
Lymphocytes Relative: 20.8 % (ref 12.0–46.0)
Lymphs Abs: 2.1 10*3/uL (ref 0.7–4.0)
MCHC: 33.8 g/dL (ref 30.0–36.0)
MCV: 86.5 fl (ref 78.0–100.0)
MONO ABS: 0.8 10*3/uL (ref 0.1–1.0)
Monocytes Relative: 8 % (ref 3.0–12.0)
NEUTROS ABS: 7 10*3/uL (ref 1.4–7.7)
Neutrophils Relative %: 68.3 % (ref 43.0–77.0)
PLATELETS: 337 10*3/uL (ref 150.0–400.0)
RBC: 4.87 Mil/uL (ref 3.87–5.11)
RDW: 13.3 % (ref 11.5–15.5)
WBC: 10.3 10*3/uL (ref 4.0–10.5)

## 2016-09-19 NOTE — Progress Notes (Addendum)
Subjective:    Patient ID: Katherine Dougherty, female    DOB: 10-26-1975, 40 y.o.   MRN: [id number]  HPI Katherine Dougherty is a very nice 40 year old white female, known to Dr. Rhea Belton and myself, last seen here in September 2016 when referred for evaluation of probable celiac disease. She definitely has gluten sensitivity and probably has celiac disease but when markers were checked a year or so ago she had been gluten-free long-term and therefore TTG was in the normal range. Decision was made not to pursue EGD with small bowel biopsies at that time as she is miserable with any gluten intake and does well when she follows a strict gluten-free diet. She comes in today after and ER visit on 09/06/2016 with acute right upper quadrant pain. She says she's been having pain now over the past couple of weeks. She has had milder episodes longer term but hadn't felt somewhat different than the severe episode that took her to the ER that would not resolve. This was associated with nausea and vomiting. She has not had any fever or chills. She says over the past week or so her pain has settled into the right upper quadrant and has been fairly constant and uncomfortable. Her appetite is been decreased and she is trying to eat very low-fat and very bland. She feels that her system is "all messed up" at this time. Even on the ER with upper abdominal ultrasound showed a 1.5 cm gallstones no gallbladder wall thickening or pericholecystic fluid she is a fatty liver and the portion of the common bile duct that was visualized 3 mm was normal. Labs on 09/06/2016 WBC of 10.8 hemoglobin 15 hematocrit of 44 liver function studies were within normal limits. She has an appointment with [facility] tomorrow but wanted to be seen here to make sure that she was on the right path.  Review of Systems Pertinent positive and negative review of systems were noted in the above HPI section.  All other review of systems was otherwise  negative.  Outpatient Encounter Prescriptions as of 09/19/2016  Medication Sig  . ADDERALL XR 30 MG 24 hr capsule Take 30 mg by mouth daily as needed (for attention). hasnt taken for a while  . levonorgestrel (MIRENA) 20 MCG/24HR IUD 1 each by Intrauterine route once.  Marland Kitchen Katherine-THROID 81.25 MG TABS Take 81.25 mg by mouth daily.   . [DISCONTINUED] aspirin-sod bicarb-citric acid (ALKA-SELTZER) 325 MG TBEF tablet Take 650 mg by mouth every 6 (six) hours as needed (for stomach).  . [DISCONTINUED] HYDROcodone-acetaminophen (NORCO/VICODIN) 5-325 MG tablet Take 2 tablets by mouth every 4 (four) hours as needed.   No facility-administered encounter medications on file as of 09/19/2016.    Allergies  Allergen Reactions  . Sulfa Antibiotics Other (See Comments)    Childhood allergy; reaction unknown   Patient Active Problem List   Diagnosis Date Noted  . 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 Iron deficiency 09/27/2015  . Hives 06/15/2015  . Thyroid disease 06/15/2015  . Thyroid activity decreased 06/15/2015  . Influenza vaccination declined 06/15/2015  . Family history of celiac disease 06/15/2015   Social History   Social History  . Marital status: Married    Spouse name: N/A  . Number of children: 2  . Years of education: N/A   Occupational History  . Hairstylist    Social History Main Topics  . Smoking status: Never Smoker  . Smokeless tobacco: Never Used  . Alcohol use No  .  Drug use: No  . Sexual activity: Yes    Birth control/ protection: IUD   Other Topics Concern  . Not on file   Social History Narrative   8 HOURS OF SLEEP PER NIGHT   LIVES AT HOME WITH HER HUSBAND AND 2 CHILDREN (5 & 3 YRS)   WORKS PART-TIME DOING HAIR   No pets    swim   G2 P2    [employer]  [employer]      works as Interior and spatial designer     Katherine Dougherty's family history includes Arthritis in her maternal grandmother; Cancer - Other in her mother; Celiac disease in her  sister; Heart disease in her maternal grandfather; Hyperlipidemia in her maternal grandfather; Hypertension in her maternal grandfather; Thyroid disease in her father and sister.      Objective:    Vitals:   09/19/16 0850  BP: (!) 134/94  Pulse: 100    Physical Exam   well-developed female in no acute distress, very pleasant blood pressure 134/94, height 5 foot 6, weight 261, BMI 42.1. HEENT ;nontraumatic normocephalic EOMI PERRLA sclera anicteric, Cardiovascular; regular rate and rhythm with S1-S2 no murmur or gallop, Pulm; clear bilaterally, Abdomen ;soft, she is tender in the right upper quadrant no rebound no palpable mass or hepatosplenomegaly bowel sounds are present, Rectal; not done, Extremities; no clubbing cyanosis or edema skin warm and dry, Neuropsych; mood and affect appropriate       Assessment & Plan:   #71 40 year old white female with 2 week history of right upper quadrant pain, initially had a bad attack but has had more constant right upper quadrant discomfort over the past week or so. Recent ER evaluation consistent with biliary colic and she has a good-sized gallstone. At the time of ultrasound on 09/06/2016 there was no evidence of cholecystitis.  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 Beverley Fiedler, MD

## 2016-09-19 NOTE — Patient Instructions (Signed)
Please go to the basement level to have your labs drawn.  Very bland low fat diet.

## 2016-09-20 ENCOUNTER — Ambulatory Visit: Payer: Self-pay | Admitting: General Surgery

## 2016-09-20 NOTE — H&P (Unsigned)
History of Present Illness   The patient is a Katherine Dougherty who presents for evaluation of gall stones. Katherine-year-old Dougherty who is referred by [place] [facility] for symptomatic gallstones. Patient states she's had 2 episodes of epigastric/right upper quadrant pain over the last 2 weeks. Patient presented to the ER at secondary current secondary to epigastric pain, nausea, vomiting. Patient states the pain occurred after eating cheese dip.  Upon evaluation ER patient's LFTs and CBC was within normal limits. Patient ultrasound revealed a 1.5 cm gallstone.   Other Problems Cholelithiasis Thyroid Disease  Past Surgical History  Oral Surgery  Diagnostic Studies History  Colonoscopy never Mammogram never Pap Smear 1-5 years ago  Allergies  Sulfa Antibiotics  Medication History Nature-Throid (81.25MG Tablet, Oral) Active. Adderall XR (30MG Capsule ER 24HR, Oral) Active. Mirena (52 MG) (20MCG/24HR IUD, Intrauterine) Active. Medications Reconciled  Social History ( Caffeine use Carbonated beverages, Tea. No alcohol use No drug use Tobacco use Never smoker.  Family History  Cancer Mother. Heart Disease Family Members In General. Hypertension Family Members In General. Ischemic Bowel Disease Sister. Thyroid problems Family Members In General, Father, Mother, Sister.  Pregnancy / Birth History Age at menarche 13 years. Contraceptive History Intrauterine device. Gravida 2 Irregular periods Length (months) of breastfeeding 12-24 Maternal age 31-35 Para 2    Review of Systems  General Present- Fatigue. Not Present- Appetite Loss, Chills, Fever, Night Sweats, Weight Gain and Weight Loss. Skin Not Present- Change in Wart/Mole, Dryness, Hives, Jaundice, New Lesions, Non-Healing Wounds, Rash and Ulcer. HEENT Present- Wears glasses/contact lenses. Not Present- Earache, Hearing Loss, Hoarseness, Nose Bleed, Oral Ulcers, Ringing in the  Ears, Seasonal Allergies, Sinus Pain, Sore Throat, Visual Disturbances and Yellow Eyes. Respiratory Present- Snoring. Not Present- Bloody sputum, Chronic Cough, Difficulty Breathing and Wheezing. Breast Not Present- Breast Mass, Breast Pain, Nipple Discharge and Skin Changes. Cardiovascular Present- Rapid Heart Rate. Not Present- Chest Pain, Difficulty Breathing Lying Down, Leg Cramps, Palpitations, Shortness of Breath and Swelling of Extremities. Gastrointestinal Present- Abdominal Pain, Change in Bowel Habits and Nausea. Not Present- Bloating, Bloody Stool, Chronic diarrhea, Constipation, Difficulty Swallowing, Excessive gas, Gets full quickly at meals, Hemorrhoids, Indigestion, Rectal Pain and Vomiting. Dougherty Genitourinary Not Present- Frequency, Nocturia, Painful Urination, Pelvic Pain and Urgency. Musculoskeletal Not Present- Back Pain, Joint Pain, Joint Stiffness, Muscle Pain, Muscle Weakness and Swelling of Extremities. Neurological Not Present- Decreased Memory, Fainting, Headaches, Numbness, Seizures, Tingling, Tremor, Trouble walking and Weakness. Psychiatric Not Present- Anxiety, Bipolar, Change in Sleep Pattern, Depression, Fearful and Frequent crying. Endocrine Not Present- Cold Intolerance, Excessive Hunger, Hair Changes, Heat Intolerance, Hot flashes and New Diabetes. Hematology Not Present- Blood Thinners, Easy Bruising, Excessive bleeding, Gland problems, HIV and Persistent Infections.  Vitals 09/20/2016 8:48 AM Weight: 260 lb Height: 66in Body Surface Area: 2.24 m Body Mass Index: 41.96 kg/m  Temp.: 98F(Temporal)  Pulse: 81 (Regular)  BP: 130/72 (Sitting, Left Arm, Standard)       Physical Exam  General Mental Status-Alert. General Appearance-Consistent with stated age. Hydration-Well hydrated. Voice-Normal.  Head and Neck Head-normocephalic, atraumatic with no lesions or palpable masses.  Eye Eyeball - Bilateral-Extraocular  movements intact. Sclera/Conjunctiva - Bilateral-No scleral icterus.  Chest and Lung Exam Chest and lung exam reveals -quiet, even and easy respiratory effort with no use of accessory muscles. Inspection Chest Wall - Normal. Back - normal.  Cardiovascular Cardiovascular examination reveals -normal heart sounds, regular rate and rhythm with no murmurs.  Abdomen Inspection Normal Exam - No Hernias. Palpation/Percussion Normal   exam - Soft, Non Tender, No Rebound tenderness, No Rigidity (guarding) and No hepatosplenomegaly. Auscultation Normal exam - Bowel sounds normal.  Neurologic Neurologic evaluation reveals -alert and oriented x 3 with no impairment of recent or remote memory. Mental Status-Normal.  Musculoskeletal Normal Exam - Left-Upper Extremity Strength Normal and Lower Extremity Strength Normal. Normal Exam - Right-Upper Extremity Strength Normal, Lower Extremity Weakness.    Assessment & Plan SYMPTOMATIC CHOLELITHIASIS (K80.20) Impression: Katherine-year-old Dougherty cinematic cholelithiasis 1. We will proceed to the operating room for a laparoscopic cholecystectomy  2. Risks and benefits were discussed with the patient to generally include, but not limited to: infection, bleeding, possible need for post op ERCP, damage to the bile ducts, bile leak, and possible need for further surgery. Alternatives were offered and described. All questions were answered and the patient voiced understanding of the procedure and wishes to proceed at this point with a laparoscopic cholecystectomy 

## 2016-10-08 NOTE — Pre-Procedure Instructions (Signed)
Katherine HoardKeri Dougherty  10/08/2016      [facility] [place] [place] - [address] [place] [address] Phone: [phone] Fax: [phone]    Your procedure is scheduled on December 20  [facility] Admitting at [employer]1130 A.M.  Call this number if you have problems the morning of surgery:  [phone]   Remember:  Do not eat food or drink liquids after midnight.   Take these medicines the morning of surgery with A SIP OF WATER NONE  7 days prior to surgery STOP taking any Aspirin, Aleve, Naproxen, Ibuprofen, Motrin, Advil, Goody's, BC's, all herbal medications, fish oil, and all vitamins    Do not wear jewelry, make-up or nail polish.  Do not wear lotions, powders, or perfumes, or deoderant.  Do not shave 48 hours prior to surgery.    Do not bring valuables to the hospital.  [facility] is not responsible for any belongings or valuables.  Contacts, dentures or bridgework may not be worn into surgery.  Leave your suitcase in the car.  After surgery it may be brought to your room.  For patients admitted to the hospital, discharge time will be determined by your treatment team.  Patients discharged the day of surgery will not be allowed to drive home.    Special instructions:   [place]- Preparing For Surgery  Before surgery, you can play an important role. Because skin is not sterile, your skin needs to be as free of germs as possible. You can reduce the number of germs on your skin by washing with CHG (chlorahexidine gluconate) Soap before surgery.  CHG is an antiseptic cleaner which kills germs and bonds with the skin to continue killing germs even after washing.  Please do not use if you have an allergy to CHG or antibacterial soaps. If your skin becomes reddened/irritated stop using the CHG.  Do not shave (including legs and underarms) for at least 48 hours prior to first CHG shower. It is OK to shave your face.  Please follow  these instructions carefully.   1. Shower the NIGHT BEFORE SURGERY and the MORNING OF SURGERY with CHG.   2. If you chose to wash your hair, wash your hair first as usual with your normal shampoo.  3. After you shampoo, rinse your hair and body thoroughly to remove the shampoo.  4. Use CHG as you would any other liquid soap. You can apply CHG directly to the skin and wash gently with a scrungie or a clean washcloth.   5. Apply the CHG Soap to your body ONLY FROM THE NECK DOWN.  Do not use on open wounds or open sores. Avoid contact with your eyes, ears, mouth and genitals (private parts). Wash genitals (private parts) with your normal soap.  6. Wash thoroughly, paying special attention to the area where your surgery will be performed.  7. Thoroughly rinse your body with warm water from the neck down.  8. DO NOT shower/wash with your normal soap after using and rinsing off the CHG Soap.  9. Pat yourself dry with a CLEAN TOWEL.   10. Wear CLEAN PAJAMAS   11. Place CLEAN SHEETS on your bed the night of your first shower and DO NOT SLEEP WITH PETS.    Day of Surgery: Do not apply any deodorants/lotions. Please wear clean clothes to the hospital/surgery center.      Please read over the following fact sheets that you  were given.

## 2016-10-09 ENCOUNTER — Encounter (HOSPITAL_COMMUNITY)
Admission: RE | Admit: 2016-10-09 | Discharge: 2016-10-09 | Disposition: A | Discharge status: Home / Self Care | Payer: BLUE CROSS/BLUE SHIELD | Source: Ambulatory Visit | Attending: General Surgery | Admitting: General Surgery

## 2016-10-09 ENCOUNTER — Encounter (HOSPITAL_COMMUNITY): Payer: Self-pay

## 2016-10-09 DIAGNOSIS — Z6841 Body Mass Index (BMI) 40.0 and over, adult: Secondary | ICD-10-CM | POA: Diagnosis not present

## 2016-10-09 DIAGNOSIS — E039 Hypothyroidism, unspecified: Secondary | ICD-10-CM | POA: Diagnosis not present

## 2016-10-09 DIAGNOSIS — K8020 Calculus of gallbladder without cholecystitis without obstruction: Secondary | ICD-10-CM | POA: Diagnosis present

## 2016-10-09 DIAGNOSIS — Z79899 Other long term (current) drug therapy: Secondary | ICD-10-CM | POA: Diagnosis not present

## 2016-10-09 DIAGNOSIS — K8012 Calculus of gallbladder with acute and chronic cholecystitis without obstruction: Secondary | ICD-10-CM | POA: Diagnosis not present

## 2016-10-09 HISTORY — DX: Personal history of pneumonia (recurrent): Z87.01

## 2016-10-09 LAB — HCG, SERUM, QUALITATIVE: Preg, Serum: NEGATIVE

## 2016-10-09 LAB — BASIC METABOLIC PANEL
Anion gap: 8 (ref 5–15)
BUN: 8 mg/dL (ref 6–20)
CALCIUM: 9.2 mg/dL (ref 8.9–10.3)
CHLORIDE: 107 mmol/L (ref 101–111)
CO2: 25 mmol/L (ref 22–32)
CREATININE: 0.84 mg/dL (ref 0.44–1.00)
GFR calc non Af Amer: >60 mL/min (ref >60)
Glucose, Bld: 102 mg/dL — ABNORMAL HIGH (ref 65–99)
Potassium: 4.1 mmol/L (ref 3.5–5.1)
SODIUM: 140 mmol/L (ref 135–145)

## 2016-10-09 LAB — CBC
HCT: 40.5 % (ref 36.0–46.0)
Hemoglobin: 13.6 g/dL (ref 12.0–15.0)
MCH: 28.8 pg (ref 26.0–34.0)
MCHC: 33.6 g/dL (ref 30.0–36.0)
MCV: 85.8 fL (ref 78.0–100.0)
PLATELETS: 283 10*3/uL (ref 150–400)
RBC: 4.72 MIL/uL (ref 3.87–5.11)
RDW: 12.8 % (ref 11.5–15.5)
WBC: 6.7 10*3/uL (ref 4.0–10.5)

## 2016-10-09 MED ORDER — DEXTROSE 5 % IV SOLN
3.0000 g | INTRAVENOUS | Status: AC
  Administered 2016-10-10: 3 g via INTRAVENOUS
  Filled 2016-10-09: qty 3000

## 2016-10-09 NOTE — Progress Notes (Signed)
PCP - Berniece AndreasWanda Panosh Cardiologist - denies  Chest x-ray - not needed EKG - no cardiac hx not needed Stress Test - denies ECHO - denies Cardiac Cath - denies    Patient denies shortness of breath, fever, cough and chest pain at PAT appointment

## 2016-10-10 ENCOUNTER — Ambulatory Visit (HOSPITAL_COMMUNITY): Payer: BLUE CROSS/BLUE SHIELD | Admitting: Certified Registered"

## 2016-10-10 ENCOUNTER — Encounter (HOSPITAL_COMMUNITY): Payer: Self-pay | Admitting: *Deleted

## 2016-10-10 ENCOUNTER — Ambulatory Visit (HOSPITAL_COMMUNITY)
Admission: RE | Admit: 2016-10-10 | Discharge: 2016-10-10 | Disposition: A | Discharge status: Home / Self Care | Payer: BLUE CROSS/BLUE SHIELD | Source: Ambulatory Visit | Attending: General Surgery | Admitting: General Surgery

## 2016-10-10 ENCOUNTER — Encounter (HOSPITAL_COMMUNITY)
Admission: RE | Disposition: A | Discharge status: Home / Self Care | Payer: Self-pay | Source: Ambulatory Visit | Attending: General Surgery

## 2016-10-10 DIAGNOSIS — Z79899 Other long term (current) drug therapy: Secondary | ICD-10-CM | POA: Insufficient documentation

## 2016-10-10 DIAGNOSIS — Z6841 Body Mass Index (BMI) 40.0 and over, adult: Secondary | ICD-10-CM | POA: Insufficient documentation

## 2016-10-10 DIAGNOSIS — K8012 Calculus of gallbladder with acute and chronic cholecystitis without obstruction: Secondary | ICD-10-CM | POA: Insufficient documentation

## 2016-10-10 DIAGNOSIS — E039 Hypothyroidism, unspecified: Secondary | ICD-10-CM | POA: Insufficient documentation

## 2016-10-10 HISTORY — PX: CHOLECYSTECTOMY: SHX55

## 2016-10-10 SURGERY — LAPAROSCOPIC CHOLECYSTECTOMY
Anesthesia: General | Site: Abdomen

## 2016-10-10 MED ORDER — HYDROMORPHONE HCL 1 MG/ML IJ SOLN
INTRAMUSCULAR | Status: AC
  Administered 2016-10-10: 0.5 mg via INTRAVENOUS
  Filled 2016-10-10: qty 1

## 2016-10-10 MED ORDER — SUGAMMADEX SODIUM 200 MG/2ML IV SOLN
INTRAVENOUS | Status: DC | PRN
  Administered 2016-10-10: 200 mg via INTRAVENOUS

## 2016-10-10 MED ORDER — OXYCODONE HCL 5 MG PO TABS
5.0000 mg | ORAL_TABLET | ORAL | Status: DC | PRN
  Administered 2016-10-10: 10 mg via ORAL

## 2016-10-10 MED ORDER — MIDAZOLAM HCL 5 MG/5ML IJ SOLN
INTRAMUSCULAR | Status: DC | PRN
  Administered 2016-10-10: 2 mg via INTRAVENOUS

## 2016-10-10 MED ORDER — FENTANYL CITRATE (PF) 100 MCG/2ML IJ SOLN
INTRAMUSCULAR | Status: AC
  Filled 2016-10-10: qty 2

## 2016-10-10 MED ORDER — BUPIVACAINE HCL 0.25 % IJ SOLN
INTRAMUSCULAR | Status: DC | PRN
  Administered 2016-10-10: 6 mL

## 2016-10-10 MED ORDER — OXYCODONE HCL 5 MG PO TABS: 5.0000 mg | ORAL_TABLET | Freq: Once | ORAL | Status: DC | PRN

## 2016-10-10 MED ORDER — OXYCODONE HCL 5 MG PO TABS
ORAL_TABLET | ORAL | Status: AC
  Administered 2016-10-10: 10 mg via ORAL
  Filled 2016-10-10: qty 2

## 2016-10-10 MED ORDER — DEXAMETHASONE SODIUM PHOSPHATE 10 MG/ML IJ SOLN
INTRAMUSCULAR | Status: DC | PRN
  Administered 2016-10-10: 10 mg via INTRAVENOUS

## 2016-10-10 MED ORDER — ROCURONIUM BROMIDE 50 MG/5ML IV SOSY
PREFILLED_SYRINGE | INTRAVENOUS | Status: AC
  Filled 2016-10-10: qty 5

## 2016-10-10 MED ORDER — LIDOCAINE 2% (20 MG/ML) 5 ML SYRINGE
INTRAMUSCULAR | Status: AC
  Filled 2016-10-10: qty 5

## 2016-10-10 MED ORDER — LACTATED RINGERS IV SOLN
INTRAVENOUS | Status: DC
  Administered 2016-10-10 (×2): via INTRAVENOUS

## 2016-10-10 MED ORDER — MIDAZOLAM HCL 2 MG/2ML IJ SOLN
INTRAMUSCULAR | Status: AC
  Filled 2016-10-10: qty 2

## 2016-10-10 MED ORDER — OXYCODONE HCL 5 MG/5ML PO SOLN: 5.0000 mg | Freq: Once | ORAL | Status: DC | PRN

## 2016-10-10 MED ORDER — SUGAMMADEX SODIUM 200 MG/2ML IV SOLN
INTRAVENOUS | Status: AC
  Filled 2016-10-10: qty 2

## 2016-10-10 MED ORDER — PROPOFOL 10 MG/ML IV BOLUS
INTRAVENOUS | Status: AC
  Filled 2016-10-10: qty 20

## 2016-10-10 MED ORDER — ONDANSETRON HCL 4 MG/2ML IJ SOLN
INTRAMUSCULAR | Status: AC
  Filled 2016-10-10: qty 2

## 2016-10-10 MED ORDER — HYDROMORPHONE HCL 1 MG/ML IJ SOLN
0.2500 mg | INTRAMUSCULAR | Status: DC | PRN
  Administered 2016-10-10: 0.25 mg via INTRAVENOUS
  Administered 2016-10-10 (×2): 0.5 mg via INTRAVENOUS
  Administered 2016-10-10: 0.25 mg via INTRAVENOUS
  Administered 2016-10-10: 0.5 mg via INTRAVENOUS

## 2016-10-10 MED ORDER — SODIUM CHLORIDE 0.9 % IR SOLN
Status: DC | PRN
  Administered 2016-10-10: 1000 mL

## 2016-10-10 MED ORDER — CHLORHEXIDINE GLUCONATE CLOTH 2 % EX PADS: 6.0000 | MEDICATED_PAD | Freq: Once | CUTANEOUS | Status: DC

## 2016-10-10 MED ORDER — OXYCODONE-ACETAMINOPHEN 5-325 MG PO TABS: 1.0000 | ORAL_TABLET | ORAL | 0 refills | Status: AC | PRN

## 2016-10-10 MED ORDER — LIDOCAINE HCL (CARDIAC) 20 MG/ML IV SOLN
INTRAVENOUS | Status: DC | PRN
  Administered 2016-10-10: 60 mg via INTRAVENOUS

## 2016-10-10 MED ORDER — FENTANYL CITRATE (PF) 100 MCG/2ML IJ SOLN
INTRAMUSCULAR | Status: DC | PRN
  Administered 2016-10-10: 50 ug via INTRAVENOUS
  Administered 2016-10-10: 100 ug via INTRAVENOUS
  Administered 2016-10-10: 50 ug via INTRAVENOUS

## 2016-10-10 MED ORDER — PROPOFOL 10 MG/ML IV BOLUS
INTRAVENOUS | Status: DC | PRN
  Administered 2016-10-10: 200 mg via INTRAVENOUS

## 2016-10-10 MED ORDER — ONDANSETRON HCL 4 MG/2ML IJ SOLN
INTRAMUSCULAR | Status: DC | PRN
  Administered 2016-10-10: 4 mg via INTRAVENOUS

## 2016-10-10 MED ORDER — ROCURONIUM BROMIDE 100 MG/10ML IV SOLN
INTRAVENOUS | Status: DC | PRN
  Administered 2016-10-10: 50 mg via INTRAVENOUS

## 2016-10-10 MED ORDER — 0.9 % SODIUM CHLORIDE (POUR BTL) OPTIME
TOPICAL | Status: DC | PRN
  Administered 2016-10-10: 1000 mL

## 2016-10-10 SURGICAL SUPPLY — 41 items
BENZOIN TINCTURE PRP APPL 2/3 (GAUZE/BANDAGES/DRESSINGS) ×3 IMPLANT
CANISTER SUCTION 2500CC (MISCELLANEOUS) ×3 IMPLANT
CHLORAPREP W/TINT 26ML (MISCELLANEOUS) ×3 IMPLANT
CLIP LIGATING HEMO O LOK GREEN (MISCELLANEOUS) ×6 IMPLANT
CLOSURE WOUND 1/2 X4 (GAUZE/BANDAGES/DRESSINGS) ×1
COVER SURGICAL LIGHT HANDLE (MISCELLANEOUS) ×3 IMPLANT
COVER TRANSDUCER ULTRASND (DRAPES) IMPLANT
DEVICE TROCAR PUNCTURE CLOSURE (ENDOMECHANICALS) ×3 IMPLANT
ELECT REM PT RETURN 9FT ADLT (ELECTROSURGICAL) ×3
ELECTRODE REM PT RTRN 9FT ADLT (ELECTROSURGICAL) ×1 IMPLANT
GAUZE SPONGE 2X2 8PLY STRL LF (GAUZE/BANDAGES/DRESSINGS) ×1 IMPLANT
GLOVE BIO SURGEON STRL SZ7 (GLOVE) ×3 IMPLANT
GLOVE BIO SURGEON STRL SZ7.5 (GLOVE) ×3 IMPLANT
GLOVE BIOGEL PI IND STRL 7.0 (GLOVE) ×2 IMPLANT
GLOVE BIOGEL PI INDICATOR 7.0 (GLOVE) ×4
GLOVE SURG SS PI 6.5 STRL IVOR (GLOVE) ×3 IMPLANT
GOWN STRL REUS W/ TWL LRG LVL3 (GOWN DISPOSABLE) ×2 IMPLANT
GOWN STRL REUS W/ TWL XL LVL3 (GOWN DISPOSABLE) ×1 IMPLANT
GOWN STRL REUS W/TWL LRG LVL3 (GOWN DISPOSABLE) ×4
GOWN STRL REUS W/TWL XL LVL3 (GOWN DISPOSABLE) ×2
KIT BASIN OR (CUSTOM PROCEDURE TRAY) ×3 IMPLANT
KIT ROOM TURNOVER OR (KITS) ×3 IMPLANT
NEEDLE INSUFFLATION 14GA 120MM (NEEDLE) ×3 IMPLANT
NS IRRIG 1000ML POUR BTL (IV SOLUTION) ×3 IMPLANT
PAD ARMBOARD 7.5X6 YLW CONV (MISCELLANEOUS) ×6 IMPLANT
POUCH RETRIEVAL ECOSAC 10 (ENDOMECHANICALS) ×1 IMPLANT
POUCH RETRIEVAL ECOSAC 10MM (ENDOMECHANICALS) ×2
SCISSORS LAP 5X35 DISP (ENDOMECHANICALS) ×3 IMPLANT
SET IRRIG TUBING LAPAROSCOPIC (IRRIGATION / IRRIGATOR) ×3 IMPLANT
SLEEVE ENDOPATH XCEL 5M (ENDOMECHANICALS) ×3 IMPLANT
SPECIMEN JAR SMALL (MISCELLANEOUS) ×3 IMPLANT
SPONGE GAUZE 2X2 STER 10/PKG (GAUZE/BANDAGES/DRESSINGS) ×2
STRIP CLOSURE SKIN 1/2X4 (GAUZE/BANDAGES/DRESSINGS) ×2 IMPLANT
SUT MNCRL AB 3-0 PS2 18 (SUTURE) ×3 IMPLANT
SUT VICRYL 0 UR6 27IN ABS (SUTURE) ×3 IMPLANT
TOWEL OR 17X24 6PK STRL BLUE (TOWEL DISPOSABLE) ×3 IMPLANT
TOWEL OR 17X26 10 PK STRL BLUE (TOWEL DISPOSABLE) ×3 IMPLANT
TRAY LAPAROSCOPIC MC (CUSTOM PROCEDURE TRAY) ×3 IMPLANT
TROCAR XCEL NON-BLD 11X100MML (ENDOMECHANICALS) ×3 IMPLANT
TROCAR XCEL NON-BLD 5MMX100MML (ENDOMECHANICALS) ×3 IMPLANT
TUBING INSUFFLATION (TUBING) ×3 IMPLANT

## 2016-10-10 NOTE — H&P (Signed)
History of Present Illness   The patient is a 40 year old female who presents for evaluation of gall stones. 40 year old [facility]. Patient states she's had 2 episodes of epigastric/right upper quadrant pain over the last 2 weeks. Patient presented to the ER at secondary current secondary to epigastric pain, nausea, vomiting. Patient states the pain occurred after eating cheese dip.  Upon evaluation ER patient's LFTs and CBC was within normal limits. Patient ultrasound revealed a 1.5 cm gallstone.   Other Problems Cholelithiasis Thyroid Disease  Past Surgical History  Oral Surgery  Diagnostic Studies History  Colonoscopy never Mammogram never Pap Smear 1-5 years ago  Allergies  Sulfa Antibiotics  Medication History Nature-Throid (81.25MG  Tablet, Oral) Active. Adderall XR (30MG  Capsule ER 24HR, Oral) Active. Mirena (52 MG) (20MCG/24HR IUD, Intrauterine) Active. Medications Reconciled  Social History ( Caffeine use Carbonated beverages, Tea. No alcohol use No drug use Tobacco use Never smoker.  Family History  Cancer Mother. Heart Disease Family Members In General. Hypertension Family Members In General. Ischemic Bowel Disease Sister. Thyroid problems Family Members In General, Father, Mother, Sister.  Pregnancy / Birth History Age at menarche 13 years. Contraceptive History Intrauterine device. Gravida 2 Irregular periods Length (months) of breastfeeding 12-24 Maternal age 39-35 Para 2    Review of Systems  General Present- Fatigue. Not Present- Appetite Loss, Chills, Fever, Night Sweats, Weight Gain and Weight Loss. Skin Not Present- Change in Wart/Mole, Dryness, Hives, Jaundice, New Lesions, Non-Healing Wounds, Rash and Ulcer. HEENT Present- Wears glasses/contact lenses. Not Present- Earache, Hearing Loss, Hoarseness, Nose Bleed, Oral Ulcers, Ringing in the  Ears, Seasonal Allergies, Sinus Pain, Sore Throat, Visual Disturbances and Yellow Eyes. Respiratory Present- Snoring. Not Present- Bloody sputum, Chronic Cough, Difficulty Breathing and Wheezing. Breast Not Present- Breast Mass, Breast Pain, Nipple Discharge and Skin Changes. Cardiovascular Present- Rapid Heart Rate. Not Present- Chest Pain, Difficulty Breathing Lying Down, Leg Cramps, Palpitations, Shortness of Breath and Swelling of Extremities. Gastrointestinal Present- Abdominal Pain, Change in Bowel Habits and Nausea. Not Present- Bloating, Bloody Stool, Chronic diarrhea, Constipation, Difficulty Swallowing, Excessive gas, Gets full quickly at meals, Hemorrhoids, Indigestion, Rectal Pain and Vomiting. Female Genitourinary Not Present- Frequency, Nocturia, Painful Urination, Pelvic Pain and Urgency. Musculoskeletal Not Present- Back Pain, Joint Pain, Joint Stiffness, Muscle Pain, Muscle Weakness and Swelling of Extremities. Neurological Not Present- Decreased Memory, Fainting, Headaches, Numbness, Seizures, Tingling, Tremor, Trouble walking and Weakness. Psychiatric Not Present- Anxiety, Bipolar, Change in Sleep Pattern, Depression, Fearful and Frequent crying. Endocrine Not Present- Cold Intolerance, Excessive Hunger, Hair Changes, Heat Intolerance, Hot flashes and New Diabetes. Hematology Not Present- Blood Thinners, Easy Bruising, Excessive bleeding, Gland problems, HIV and Persistent Infections.  Vitals 09/20/2016 8:48 AM Weight: 260 lb Height: 66in Body Surface Area: 2.24 m Body Mass Index: 41.96 kg/m  Temp.: 44F(Temporal)  Pulse: 81 (Regular)  BP: 130/72 (Sitting, Left Arm, Standard)       Physical Exam  General Mental Status-Alert. General Appearance-Consistent with stated age. Hydration-Well hydrated. Voice-Normal.  Head and Neck Head-normocephalic, atraumatic with no lesions or palpable masses.  Eye Eyeball - Bilateral-Extraocular  movements intact. Sclera/Conjunctiva - Bilateral-No scleral icterus.  Chest and Lung Exam Chest and lung exam reveals -quiet, even and easy respiratory effort with no use of accessory muscles. Inspection Chest Wall - Normal. Back - normal.  Cardiovascular Cardiovascular examination reveals -normal heart sounds, regular rate and rhythm with no murmurs.  Abdomen Inspection Normal Exam - No Hernias. Palpation/Percussion Normal  exam - Soft, Non Tender, No Rebound tenderness, No Rigidity (guarding) and No hepatosplenomegaly. Auscultation Normal exam - Bowel sounds normal.  Neurologic Neurologic evaluation reveals -alert and oriented x 3 with no impairment of recent or remote memory. Mental Status-Normal.  Musculoskeletal Normal Exam - Left-Upper Extremity Strength Normal and Lower Extremity Strength Normal. Normal Exam - Right-Upper Extremity Strength Normal, Lower Extremity Weakness.    Assessment & Plan SYMPTOMATIC CHOLELITHIASIS (K80.20) Impression: 40 year old female cinematic cholelithiasis 1. We will proceed to the operating room for a laparoscopic cholecystectomy  2. Risks and benefits were discussed with the patient to generally include, but not limited to: infection, bleeding, possible need for post op ERCP, damage to the bile ducts, bile leak, and possible need for further surgery. Alternatives were offered and described. All questions were answered and the patient voiced understanding of the procedure and wishes to proceed at this point with a laparoscopic cholecystectomy

## 2016-10-10 NOTE — Anesthesia Preprocedure Evaluation (Signed)
Anesthesia Evaluation  Patient identified by MRN, date of birth, ID band Patient awake    Reviewed: Allergy & Precautions, NPO status , Patient's Chart, lab work & pertinent test results  History of Anesthesia Complications Negative for: history of anesthetic complications  Airway Mallampati: II  TM Distance: >3 FB Neck ROM: Full    Dental  (+) Teeth Intact   Pulmonary    breath sounds clear to auscultation       Cardiovascular negative cardio ROS   Rhythm:Regular     Neuro/Psych negative neurological ROS  negative psych ROS   GI/Hepatic Neg liver ROS, gallstones   Endo/Other  Hypothyroidism Morbid obesity  Renal/GU      Musculoskeletal   Abdominal   Peds  Hematology negative hematology ROS (+)   Anesthesia Other Findings   Reproductive/Obstetrics                             Anesthesia Physical Anesthesia Plan  ASA: II  Anesthesia Plan: General   Post-op Pain Management:    Induction: Intravenous  Airway Management Planned: Oral ETT  Additional Equipment: None  Intra-op Plan:   Post-operative Plan: Extubation in OR  Informed Consent: I have reviewed the patients History and Physical, chart, labs and discussed the procedure including the risks, benefits and alternatives for the proposed anesthesia with the patient or authorized representative who has indicated his/her understanding and acceptance.   Dental advisory given  Plan Discussed with: CRNA and Surgeon  Anesthesia Plan Comments:         Anesthesia Quick Evaluation

## 2016-10-10 NOTE — Anesthesia Procedure Notes (Signed)
Procedure Name: Intubation Date/Time: 10/10/2016 1:09 PM Performed by: Arlice ColtMANESS, [place] Mckeough B Pre-anesthesia Checklist: Patient identified, Emergency Drugs available, Suction available, Patient being monitored and Timeout performed Patient Re-evaluated:Patient Re-evaluated prior to inductionOxygen Delivery Method: Circle system utilized Preoxygenation: Pre-oxygenation with 100% oxygen Intubation Type: IV induction Ventilation: Mask ventilation without difficulty Laryngoscope Size: Mac and 3 Grade View: Grade I Tube type: Oral Tube size: 7.0 mm Number of attempts: 1 Airway Equipment and Method: Stylet Placement Confirmation: ETT inserted through vocal cords under direct vision,  positive ETCO2 and breath sounds checked- equal and bilateral Secured at: 21 cm Tube secured with: Tape Dental Injury: Teeth and Oropharynx as per pre-operative assessment

## 2016-10-10 NOTE — Op Note (Signed)
10/10/2016  2:06 PM  PATIENT:  Katherine Dougherty  40 y.o. female  PRE-OPERATIVE DIAGNOSIS:  CHOLELITHIASIS   POST-OPERATIVE DIAGNOSIS:  CHOLELITHIASIS  PROCEDURE:  Procedure(s): LAPAROSCOPIC CHOLECYSTECTOMY (N/A)  SURGEON:  Surgeon(s) and Role:    * Axel FillerArmando Zaivion Kundrat, MD - Primary  ANESTHESIA:   local and general  EBL: 5cc Total I/O In: 1000 [I.V.:1000] Out: -   BLOOD ADMINISTERED:none  DRAINS: none   LOCAL MEDICATIONS USED:  BUPIVICAINE   SPECIMEN:  Source of Specimen:  gallbladder  DISPOSITION OF SPECIMEN:  PATHOLOGY  COUNTS:  YES  TOURNIQUET:  * No tourniquets in log *  DICTATION: .Dragon Dictation  Complications: none   Counts: reported as correct x 2   Findings:Chronic inflammation of the gallbladder and large gallstones  Indications for procedure: Pt is a 40 y/o F with RUQ pain and seen to have gallstones.   Details of the procedure: The patient was taken to the operating and placed in the supine position with bilateral SCDs in place. A time out was called and all facts were verified. A pneumoperitoneum was obtained via A Veress needle technique to a pressure of 14mm of mercury. A 5mm trochar was then placed in the right upper quadrant under visualization, and there were no injuries to any abdominal organs. A 11 mm port was then placed in the umbilical region after infiltrating with local anesthesia under direct visualization. A second epigastric port was placed under direct visualization.   The gallbladder was identified and retracted, the peritoneum was then sharply dissected from the gallbladder and this dissection was carried down to Calot's triangle. The cystic duct was identified and dissected circumferentially and seen going into the gallbladder 360.  The cystic artery was dissected away from the surrounding tissues.   The critical angle was obtained.   2 clips were placed proximally one distally and the cystic duct transected. The cystic artery was identified  and 2 clips placed proximally and one distally and transected. We then proceeded to remove the gallbladder off the hepatic fossa with Bovie cautery. A retrieval bag was then placed in the abdomen and gallbladder placed in the bag. The hepatic fossa was then reexamined and hemostasis was achieved with Bovie cautery and was excellent at this portion of the case. The subhepatic fossa and perihepatic fossa was then irrigated until the effluent was clear. The specimen bag and specimen were removed from the abdominal cavity.  The 11 mm trocar fascia was reapproximated with the Endo Close #1 Vicryl x3. The pneumoperitoneum was evacuated and all trochars removed under direct visulalization. The skin was then closed with 4-0 Monocryl and the skin dressed with Steri-Strips, gauze, and tape. The patient was awaken from general anesthesia and taken to the recovery room in stable condition.     PLAN OF CARE: Discharge to home after PACU  PATIENT DISPOSITION:  PACU - hemodynamically stable.   Delay start of Pharmacological VTE agent (>24hrs) due to surgical blood loss or risk of bleeding: not applicable

## 2016-10-10 NOTE — Discharge Instructions (Signed)
[facility] ______[facility] °LAPAROSCOPIC SURGERY: POST OP INSTRUCTIONS °Always review your discharge instruction sheet given to you by the facility where your surgery was performed. °IF YOU HAVE DISABILITY OR FAMILY LEAVE FORMS, YOU MUST BRING THEM TO THE OFFICE FOR PROCESSING.   °DO NOT GIVE THEM TO YOUR DOCTOR. ° °1. A prescription for pain medication may be given to you upon discharge.  Take your pain medication as prescribed, if needed.  If narcotic pain medicine is not needed, then you may take acetaminophen (Tylenol) or ibuprofen (Advil) as needed. °2. Take your usually prescribed medications unless otherwise directed. °3. If you need a refill on your pain medication, please contact your pharmacy.  They will contact our office to request authorization. Prescriptions will not be filled after 5pm or on week-ends. °4. You should follow a light diet the first few days after arrival home, such as soup and crackers, etc.  Be sure to include lots of fluids daily. °5. Most patients will experience some swelling and bruising in the area of the incisions.  Ice packs will help.  Swelling and bruising can take several days to resolve.  °6. It is common to experience some constipation if taking pain medication after surgery.  Increasing fluid intake and taking a stool softener (such as Colace) will usually help or prevent this problem from occurring.  A mild laxative (Milk of Magnesia or Miralax) should be taken according to package instructions if there are no bowel movements after 48 hours. °7. Unless discharge instructions indicate otherwise, you may remove your bandages 24-48 hours after surgery, and you may shower at that time.  You may have steri-strips (small skin tapes) in place directly over the incision.  These strips should be left on the skin for 7-10 days.  If your surgeon used skin glue on the incision, you may shower in 24 hours.  The glue will flake off over the next 2-3 weeks.  Any sutures or  staples will be removed at the office during your follow-up visit. °8. ACTIVITIES:  You may resume regular (light) daily activities beginning the next day--such as daily self-care, walking, climbing stairs--gradually increasing activities as tolerated.  You may have sexual intercourse when it is comfortable.  Refrain from any heavy lifting or straining until approved by your doctor. °a. You may drive when you are no longer taking prescription pain medication, you can comfortably wear a seatbelt, and you can safely maneuver your car and apply brakes. °b. RETURN TO WORK:  __________________________________________________________ °9. You should see your doctor in the office for a follow-up appointment approximately 2-3 weeks after your surgery.  Make sure that you call for this appointment within a day or two after you arrive home to insure a convenient appointment time. °10. OTHER INSTRUCTIONS: __________________________________________________________________________________________________________________________ __________________________________________________________________________________________________________________________ °WHEN TO CALL YOUR DOCTOR: °1. Fever over 101.0 °2. Inability to urinate °3. Continued bleeding from incision. °4. Increased pain, redness, or drainage from the incision. °5. Increasing abdominal pain ° °The clinic staff is available to answer your questions during regular business hours.  Please don’t hesitate to call and ask to speak to one of the nurses for clinical concerns.  If you have a medical emergency, go to the nearest emergency room or call 911.  A surgeon from [facility] is always on call at the hospital. °[address], [address], [place], [place]  [address] ? [address] [address], [place], [place]   [address] °[phone] ? [phone] ? FAX [phone] °Web site:   [email or web address] °

## 2016-10-10 NOTE — Transfer of Care (Signed)
Immediate Anesthesia Transfer of Care Note  Patient: Katherine Dougherty  Procedure(s) Performed: Procedure(s): LAPAROSCOPIC CHOLECYSTECTOMY (N/A)  Patient Location: PACU  Anesthesia Type:General  Level of Consciousness: awake, alert  and oriented  Airway & Oxygen Therapy: Patient Spontanous Breathing and Patient connected to nasal cannula oxygen  Post-op Assessment: Report given to RN and Post -op Vital signs reviewed and stable  Post vital signs: Reviewed and stable  Last Vitals:  Vitals:   10/10/16 1414 10/10/16 1429  BP:  (!) 146/76  Pulse:  88  Resp:  (!) 23  Temp: 36.7 C     Last Pain:  Vitals:   10/10/16 1414  PainSc: 5          Complications: No apparent anesthesia complications

## 2016-10-11 ENCOUNTER — Encounter (HOSPITAL_COMMUNITY): Payer: Self-pay | Admitting: General Surgery

## 2016-10-11 NOTE — Anesthesia Postprocedure Evaluation (Signed)
Anesthesia Post Note  Patient: Katherine Dougherty  Procedure(s) Performed: Procedure(s) (LRB): LAPAROSCOPIC CHOLECYSTECTOMY (N/A)  Patient location during evaluation: PACU Anesthesia Type: General Level of consciousness: awake and alert Pain management: pain level controlled Vital Signs Assessment: post-procedure vital signs reviewed and stable Respiratory status: spontaneous breathing, nonlabored ventilation, respiratory function stable and patient connected to nasal cannula oxygen Cardiovascular status: blood pressure returned to baseline and stable Postop Assessment: no signs of nausea or vomiting Anesthetic complications: no       Last Vitals:  Vitals:   10/10/16 1515 10/10/16 1547  BP: (!) 147/94 (!) 160/81  Pulse: 88 81  Resp: 16 18  Temp:      Last Pain:  Vitals:   10/10/16 1515  PainSc: 5                  Tiasha Helvie

## 2017-07-12 ENCOUNTER — Encounter: Payer: Self-pay | Admitting: Internal Medicine

## 2017-07-14 IMAGING — US US ABDOMEN LIMITED
1 series · 14 of 25 positions shown · non-contrast
Comparison: None.

CLINICAL DATA: Right upper quadrant abdominal pain starting today.

EXAM:
US ABDOMEN LIMITED - RIGHT UPPER QUADRANT

[Series 1: us abdomen limited · 0.26mm/px · 14 of 42 slices shown]
[im 1/42]
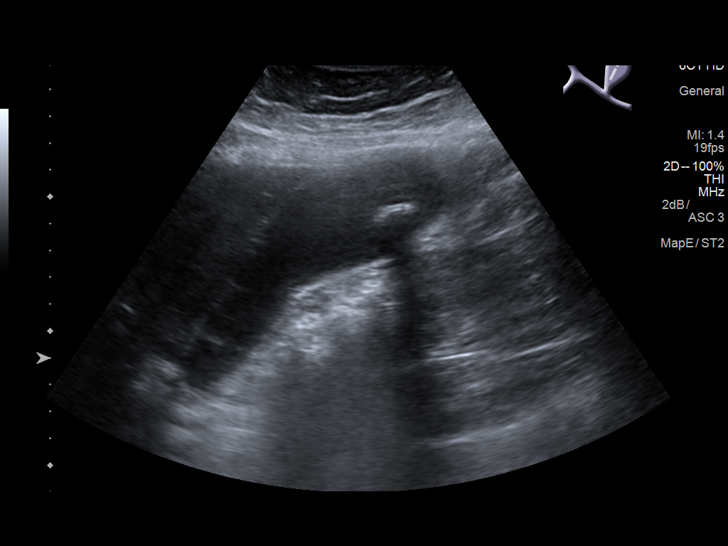
[im 4/42]
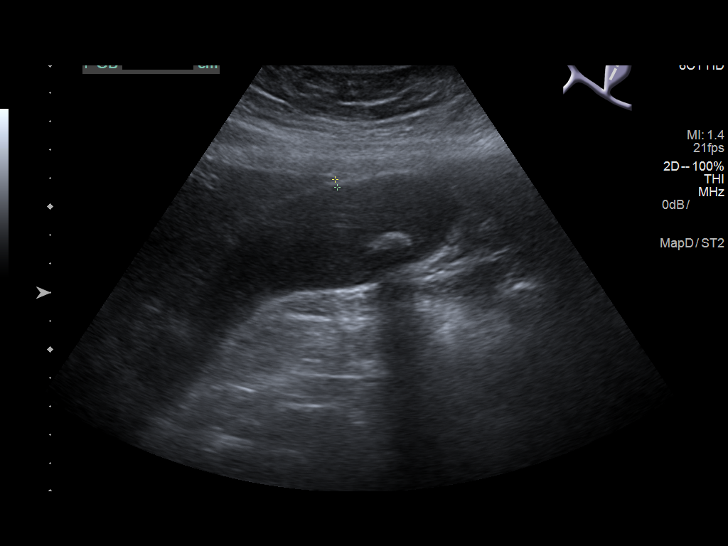
[im 7/42]
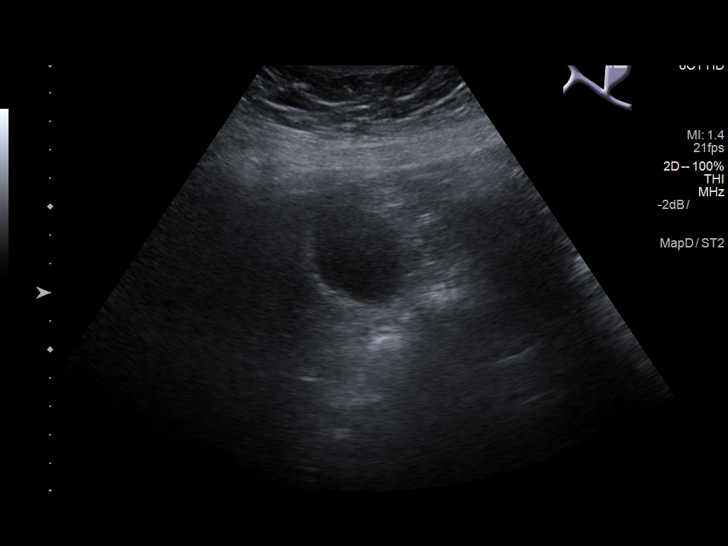
[im 11/42]
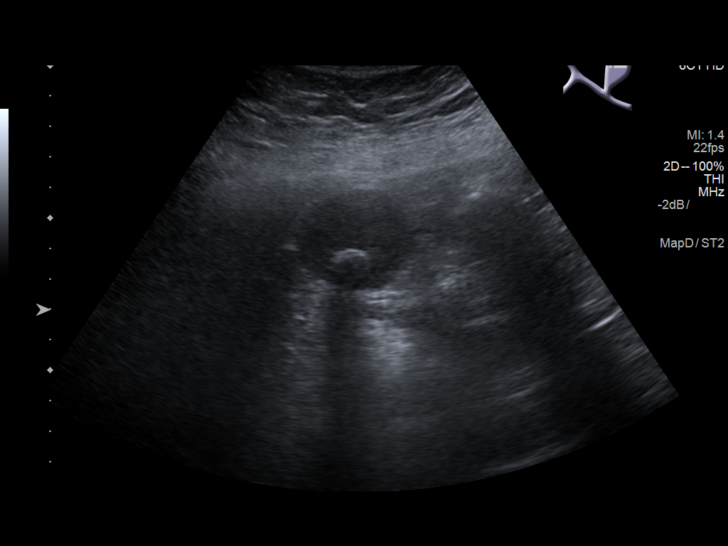
[im 14/42]
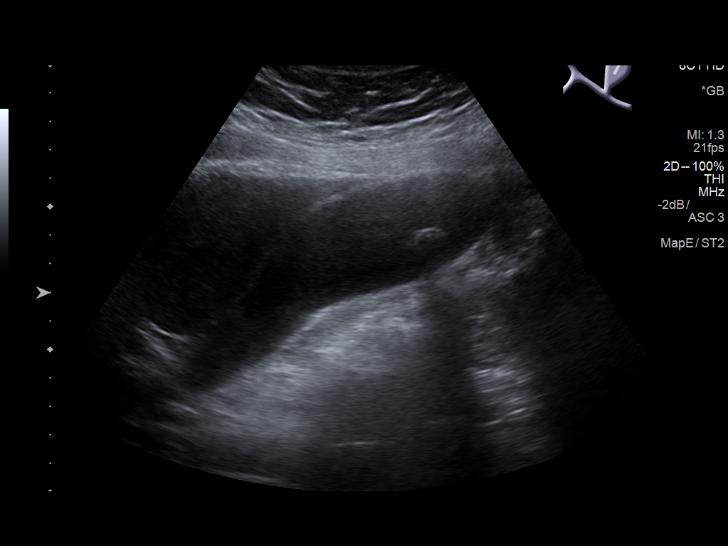
[im 16/42]
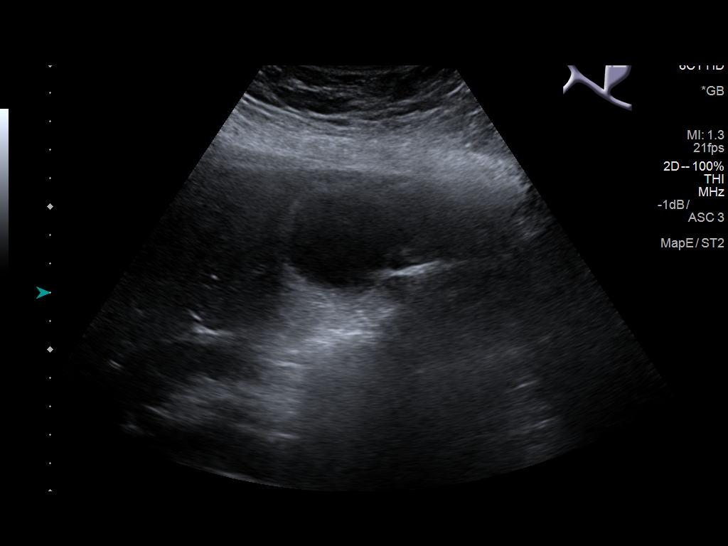
[im 19/42]
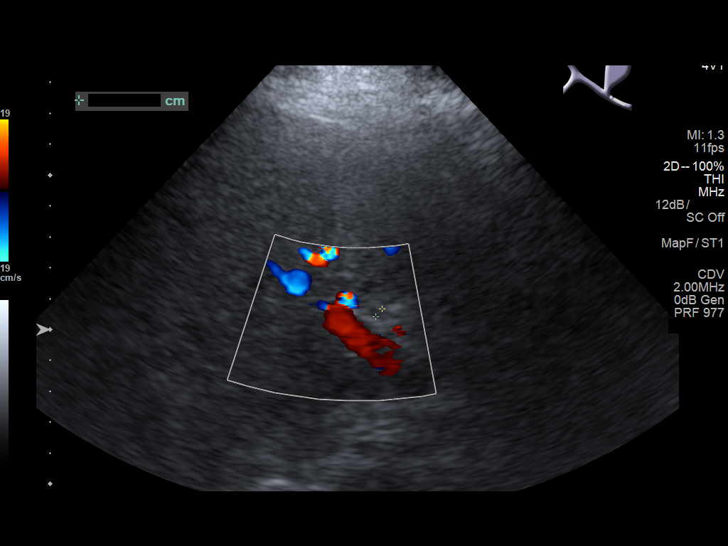
[im 23/42]
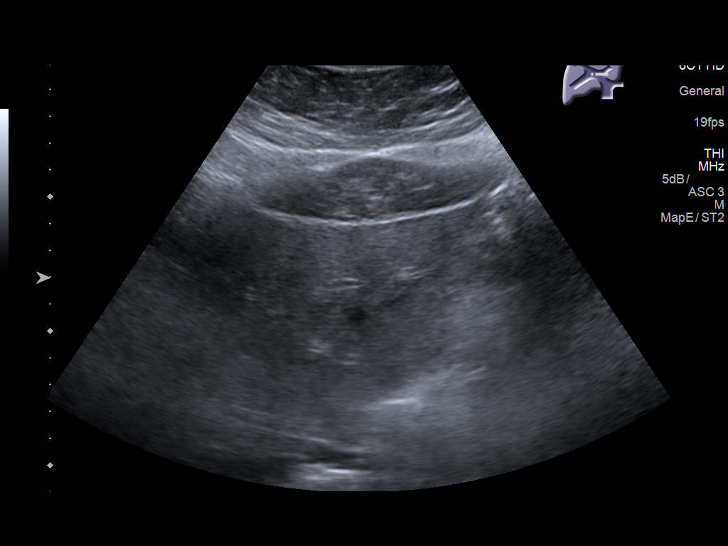
[im 26/42]
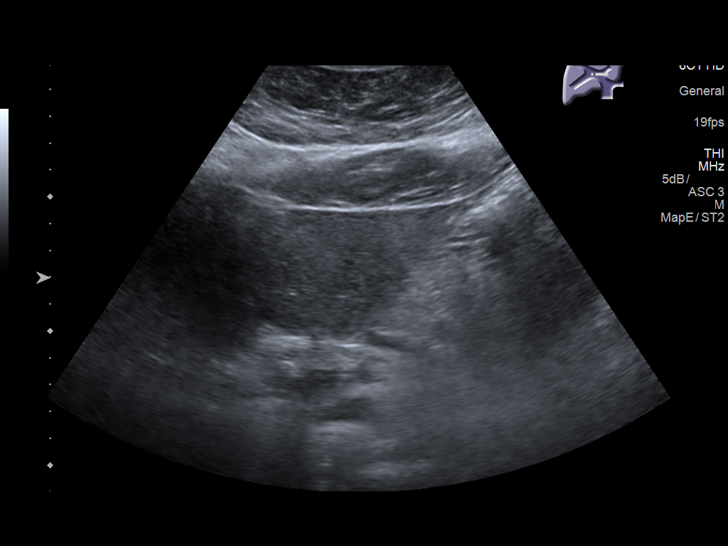
[im 28/42]
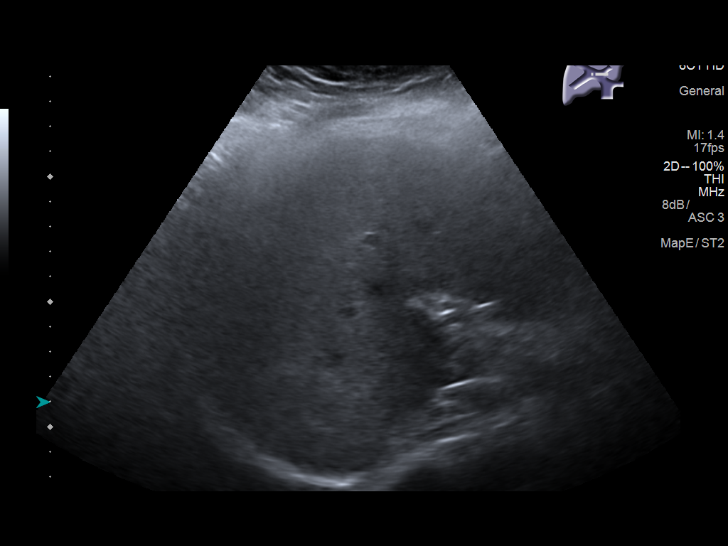
[im 31/42]
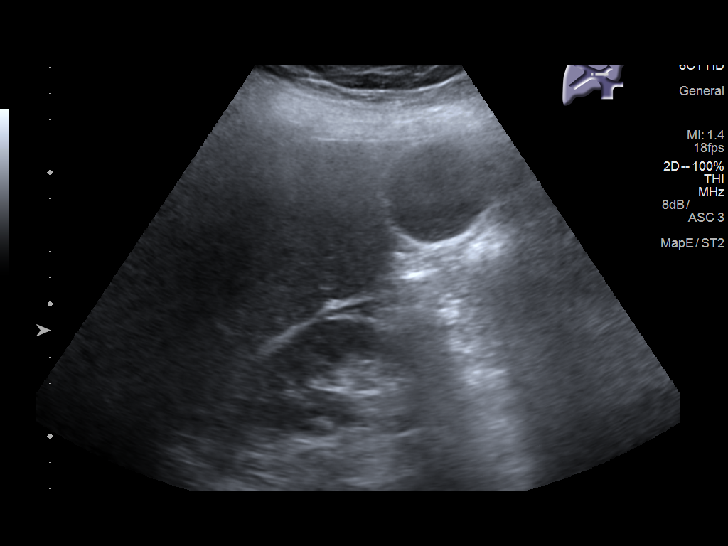
[im 35/42]
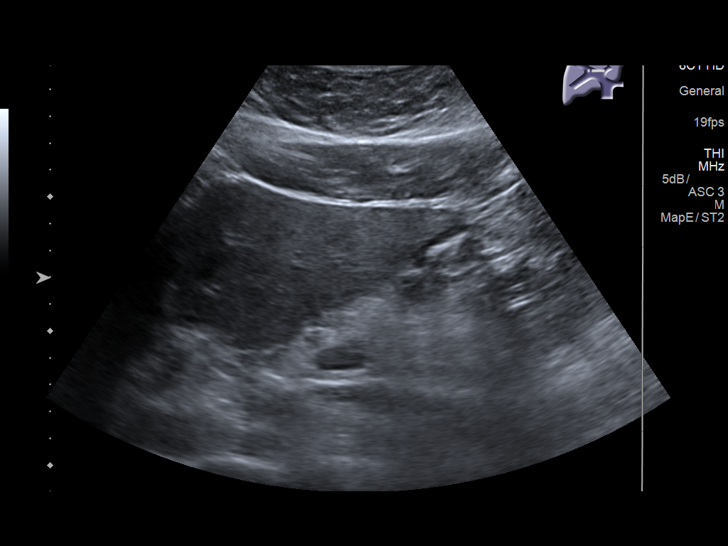
[im 38/42]
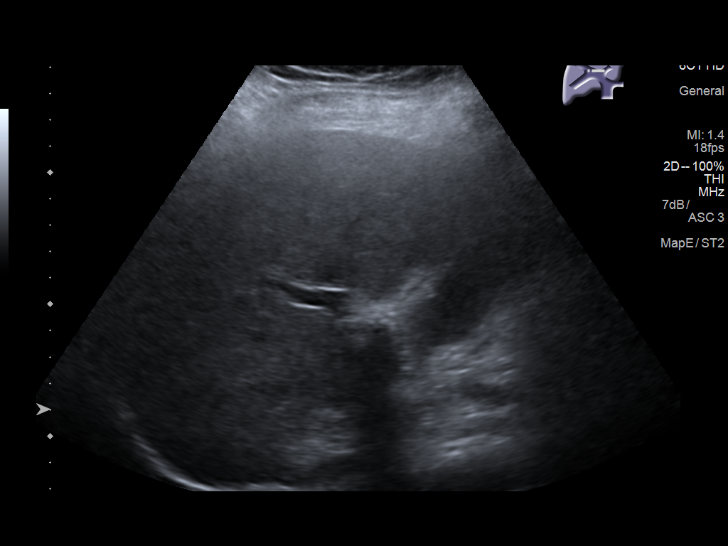
[im 42/42]
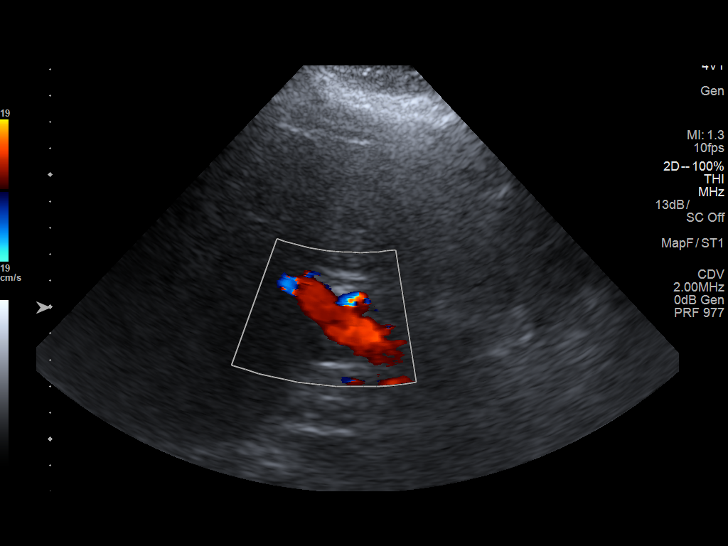

[14 of 25 positions shown; findings below may reference images not displayed]

FINDINGS: Gallbladder:

A 1.5 cm in diameter gallstone is present in the gallbladder. No
gallbladder wall thickening or pericholecystic fluid. Sonographic
Murphy sign is absent.

Common bile duct:

Diameter: 3 mm

Liver:

No focal lesion identified. Coarse echogenic liver with poor sonic
penetration compatible with diffuse hepatic steatosis.
IMPRESSION: 1. Coarse echogenic liver with poor sonic penetration compatible
with diffuse hepatic steatosis.
2. We visualize a single 1.5 cm gallstone in the gallbladder. No
gallbladder wall thickening or pericholecystic fluid. No
intrahepatic biliary dilatation or directly visualized
choledocholithiasis, although only a very small segment of the CBD
is visible.

## 2020-06-22 DEATH — deceased
